# Patient Record
Sex: Female | Born: 1995 | Race: Black or African American | Hispanic: No | Marital: Single | Smoking: Never smoker
Health system: Southern US, Community
[De-identification: ages and names within clinical notes are randomized; demographics above are authoritative.]

## PROBLEM LIST (undated history)

## (undated) DIAGNOSIS — J40 Bronchitis, not specified as acute or chronic: Secondary | ICD-10-CM

## (undated) HISTORY — PX: TONSILLECTOMY: SUR1361

---
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
## 2015-06-04 ENCOUNTER — Emergency Department (HOSPITAL_BASED_OUTPATIENT_CLINIC_OR_DEPARTMENT_OTHER)
Admission: EM | Admit: 2015-06-04 | Discharge: 2015-06-04 | Disposition: A | Discharge status: Home / Self Care | Payer: 59 | Attending: Emergency Medicine | Admitting: Emergency Medicine

## 2015-06-04 ENCOUNTER — Emergency Department (HOSPITAL_BASED_OUTPATIENT_CLINIC_OR_DEPARTMENT_OTHER): Payer: 59

## 2015-06-04 ENCOUNTER — Encounter (HOSPITAL_BASED_OUTPATIENT_CLINIC_OR_DEPARTMENT_OTHER): Payer: Self-pay | Admitting: *Deleted

## 2015-06-04 DIAGNOSIS — J45909 Unspecified asthma, uncomplicated: Secondary | ICD-10-CM | POA: Diagnosis not present

## 2015-06-04 DIAGNOSIS — R079 Chest pain, unspecified: Secondary | ICD-10-CM | POA: Diagnosis present

## 2015-06-04 DIAGNOSIS — R0789 Other chest pain: Secondary | ICD-10-CM | POA: Diagnosis not present

## 2015-06-04 DIAGNOSIS — Z79899 Other long term (current) drug therapy: Secondary | ICD-10-CM | POA: Insufficient documentation

## 2015-06-04 DIAGNOSIS — Z3202 Encounter for pregnancy test, result negative: Secondary | ICD-10-CM | POA: Diagnosis not present

## 2015-06-04 DIAGNOSIS — R519 Headache, unspecified: Secondary | ICD-10-CM

## 2015-06-04 DIAGNOSIS — R51 Headache: Secondary | ICD-10-CM | POA: Insufficient documentation

## 2015-06-04 HISTORY — DX: Bronchitis, not specified as acute or chronic: J40

## 2015-06-04 LAB — TROPONIN I: Troponin I: <0.03 ng/mL (ref <0.031)

## 2015-06-04 LAB — CBC WITH DIFFERENTIAL/PLATELET
BASOS ABS: 0 10*3/uL (ref 0.0–0.1)
BASOS PCT: 0 %
EOS ABS: 0.1 10*3/uL (ref 0.0–0.7)
Eosinophils Relative: 1 %
HCT: 37.5 % (ref 36.0–46.0)
HEMOGLOBIN: 12.3 g/dL (ref 12.0–15.0)
Lymphocytes Relative: 33 %
Lymphs Abs: 2.1 10*3/uL (ref 0.7–4.0)
MCH: 29.7 pg (ref 26.0–34.0)
MCHC: 32.8 g/dL (ref 30.0–36.0)
MCV: 90.6 fL (ref 78.0–100.0)
Monocytes Absolute: 0.5 10*3/uL (ref 0.1–1.0)
Monocytes Relative: 8 %
NEUTROS PCT: 58 %
Neutro Abs: 3.6 10*3/uL (ref 1.7–7.7)
Platelets: 314 10*3/uL (ref 150–400)
RBC: 4.14 MIL/uL (ref 3.87–5.11)
RDW: 12.4 % (ref 11.5–15.5)
WBC: 6.2 10*3/uL (ref 4.0–10.5)

## 2015-06-04 LAB — COMPREHENSIVE METABOLIC PANEL
ALBUMIN: 4.3 g/dL (ref 3.5–5.0)
ALK PHOS: 60 U/L (ref 38–126)
ALT: 18 U/L (ref 14–54)
ANION GAP: 7 (ref 5–15)
AST: 19 U/L (ref 15–41)
BUN: 8 mg/dL (ref 6–20)
CALCIUM: 9 mg/dL (ref 8.9–10.3)
CO2: 24 mmol/L (ref 22–32)
CREATININE: 0.75 mg/dL (ref 0.44–1.00)
Chloride: 106 mmol/L (ref 101–111)
GFR calc Af Amer: >60 mL/min (ref >60)
GFR calc non Af Amer: >60 mL/min (ref >60)
GLUCOSE: 103 mg/dL — AB (ref 65–99)
Potassium: 3.5 mmol/L (ref 3.5–5.1)
SODIUM: 137 mmol/L (ref 135–145)
TOTAL PROTEIN: 7.8 g/dL (ref 6.5–8.1)
Total Bilirubin: 0.8 mg/dL (ref 0.3–1.2)

## 2015-06-04 LAB — PREGNANCY, URINE: Preg Test, Ur: NEGATIVE

## 2015-06-04 MED ORDER — NORTRIPTYLINE HCL 10 MG PO CAPS: 10.0000 mg | ORAL_CAPSULE | Freq: Every day | ORAL | Status: AC

## 2015-06-04 MED ORDER — CYCLOBENZAPRINE HCL 10 MG PO TABS
5.0000 mg | ORAL_TABLET | Freq: Once | ORAL | Status: AC
  Administered 2015-06-04: 5 mg via ORAL
  Filled 2015-06-04: qty 1

## 2015-06-04 MED ORDER — CYCLOBENZAPRINE HCL 5 MG PO TABS: 5.0000 mg | ORAL_TABLET | Freq: Three times a day (TID) | ORAL | Status: DC | PRN

## 2015-06-04 MED ORDER — IBUPROFEN 600 MG PO TABS: 600.0000 mg | ORAL_TABLET | Freq: Four times a day (QID) | ORAL | Status: DC | PRN

## 2015-06-04 MED ORDER — MORPHINE SULFATE (PF) 4 MG/ML IV SOLN
4.0000 mg | Freq: Once | INTRAVENOUS | Status: AC
  Administered 2015-06-04: 4 mg via INTRAVENOUS
  Filled 2015-06-04: qty 1

## 2015-06-04 MED FILL — IBUPROFEN 600 MG TABLET: 600 | 7 days supply | Qty: 30 | Fill #0

## 2015-06-04 MED FILL — CYCLOBENZAPRINE 5 MG TABLET: 5 | 3 days supply | Qty: 10 | Fill #0

## 2015-06-04 MED FILL — NORTRIPTYLINE HCL 10 MG CAP: 10 | 30 days supply | Qty: 30 | Fill #0

## 2015-06-04 NOTE — ED Notes (Signed)
C/o mid to right c/p that started at 1145 this am with sob and h/a. Pt took albuterol at work. Now c/o h/a and chest tightness.Arrived via [facility] and they gave 324 mg aspirin and 20 ga to left hand SL.

## 2015-06-04 NOTE — Discharge Instructions (Signed)
Take motrin for pain.  Take flexeril for muscle spasms.   Take nortriptyline for headaches.   Follow up with your doctor and neurologist.   Return to ER if you have severe headaches, vomiting, chest pain, shortness of breath.

## 2015-06-04 NOTE — ED Notes (Signed)
Patient preparing for discharge. 

## 2015-06-04 NOTE — ED Notes (Signed)
Patient transported to and from radiology department via stretcher. 

## 2015-06-04 NOTE — ED Provider Notes (Signed)
CSN: [id number]     Arrival date & time 06/04/15  1314 History   First MD Initiated Contact with Patient 06/04/15 1319     No chief complaint on file.    (Consider location/radiation/quality/duration/timing/severity/associated sxs/prior Treatment) The history is provided by the patient.  Tammy Stafford is a 20 y.o. female hx of bronchitis here with chest pain, headaches. Patient has chronic headaches and has been seen previously by neurology. She was started on Nortriptyline and finished a prednisone taper several months ago but without nortriptyline. Has been having intermittent headaches for the last several months. Denies any nausea vomiting from that. Patient also has intermittent chest tightness that happens at work. Today around 11:45 AM, patient complains of some chest tightness at work. She tried albuterol inhaler but didn't work. She was given aspirin by one of her colleagues had a neb treatment but still feels tight. Denies fever or cough. Denies hx of CAD and no family hx of CAD.          Past Medical History  Diagnosis Date  . Asthma   . Bronchitis    Past Surgical History  Procedure Laterality Date  . Tonsillectomy     No family history on file. Social History  Substance Use Topics  . Smoking status: Never Smoker   . Smokeless tobacco: Never Used  . Alcohol Use: No   OB History    No data available     Review of Systems  Cardiovascular: Positive for chest pain.  All other systems reviewed and are negative.     Allergies  Review of patient's allergies indicates no known allergies.  Home Medications   Prior to Admission medications   Medication Sig Start Date End Date Taking? Authorizing Provider  albuterol (PROVENTIL HFA;VENTOLIN HFA) 108 (90 BASE) MCG/ACT inhaler Inhale 2 puffs into the lungs every 6 (six) hours as needed. Wheezing and shortness of breath   Yes Historical Provider, MD  ibuprofen (ADVIL,MOTRIN) 600 MG tablet Take 1 tablet (600 mg total)  by mouth every 6 (six) hours as needed for pain. 01/21/12  Yes Fayrene Helper, PA-C   BP 105/74 mmHg  Pulse 82  Temp(Src) 98.8 F (37.1 C) (Oral)  Resp 20  Ht  (1.6 m)  Wt 155 lb (70.308 kg)  BMI 27.46 kg/m2  SpO2 99% Physical Exam  Constitutional: She is oriented to person, place, and time. She appears well-developed and well-nourished.  HENT:  Head: Normocephalic.  Mouth/Throat: Oropharynx is clear and moist.  Eyes: Conjunctivae are normal. Pupils are equal, round, and reactive to light.  Neck: Normal range of motion. Neck supple.  Cardiovascular: Normal rate, regular rhythm and normal heart sounds.   Pulmonary/Chest: Effort normal and breath sounds normal.  Reproducible chest tenderness.   Abdominal: Soft. Bowel sounds are normal. She exhibits no distension. There is no tenderness. There is no rebound.  Musculoskeletal: Normal range of motion. She exhibits no edema or tenderness.  Neurological: She is alert and oriented to person, place, and time. No cranial nerve deficit. Coordination normal.  Skin: Skin is warm and dry.  Psychiatric: She has a normal mood and affect. Her behavior is normal. Judgment and thought content normal.  Nursing note and vitals reviewed.   ED Course  Procedures (including critical care time) Labs Review Labs Reviewed  COMPREHENSIVE METABOLIC PANEL - Abnormal; Notable for the following:    Glucose, Bld 103 (*)    All other components within normal limits  CBC WITH DIFFERENTIAL/PLATELET  TROPONIN I  PREGNANCY, URINE    Imaging Review Dg Chest 2 View  06/04/2015  CLINICAL DATA:  Right-sided chest pain. EXAM: CHEST  2 VIEW COMPARISON:  None. FINDINGS: The heart size and mediastinal contours are within normal limits. Both lungs are clear. The visualized skeletal structures are unremarkable. IMPRESSION: No active cardiopulmonary disease. Electronically Signed   By: Richarda Overlie M.D.   On: 06/04/2015 13:55   I have personally reviewed and evaluated  these images and lab results as part of my medical decision-making.   EKG Interpretation   Date/Time:  Thursday June 04 2015 13:27:21 EST Ventricular Rate:  71 PR Interval:  147 QRS Duration: 75 QT Interval:  376 QTC Calculation: 409 R Axis:   54 Text Interpretation:  Sinus rhythm Baseline wander in lead(s) I II aVR aVF  V6 No significant change since last tracing Confirmed by YAO  MD, DAVID  ([id number]) on 06/04/2015 1:35:08 PM      MDM   Final diagnoses:  None   Tammy Stafford is a 20 y.o. female here with chest tightness. Lungs clear. Afebrile, not tachy. I doubt ACS or PE. Likely muscle strain vs anxiety. Will get labs, CXR. Will give flexeril.   2:53 PM Felt better. Vitals stable. Labs and CXR nl. I think likely muscle strain. Headaches are chronic, nl neuro exam and was on nortriptyline but ran out. Will dc home with flexeril, nortiptyline and have her f/u with PCP, neurology.      Richardean Canal, MD 06/04/15 [phone]

## 2015-09-09 ENCOUNTER — Encounter (HOSPITAL_BASED_OUTPATIENT_CLINIC_OR_DEPARTMENT_OTHER): Payer: Self-pay | Admitting: *Deleted

## 2015-09-09 ENCOUNTER — Emergency Department (HOSPITAL_BASED_OUTPATIENT_CLINIC_OR_DEPARTMENT_OTHER)
Admission: EM | Admit: 2015-09-09 | Discharge: 2015-09-09 | Disposition: A | Discharge status: Home / Self Care | Payer: 59 | Attending: Emergency Medicine | Admitting: Emergency Medicine

## 2015-09-09 DIAGNOSIS — F419 Anxiety disorder, unspecified: Secondary | ICD-10-CM | POA: Insufficient documentation

## 2015-09-09 DIAGNOSIS — J45909 Unspecified asthma, uncomplicated: Secondary | ICD-10-CM | POA: Insufficient documentation

## 2015-09-09 LAB — CBG MONITORING, ED: Glucose-Capillary: 105 mg/dL — ABNORMAL HIGH (ref 65–99)

## 2015-09-09 MED ORDER — LORAZEPAM 1 MG PO TABS
1.0000 mg | ORAL_TABLET | Freq: Once | ORAL | Status: AC
  Administered 2015-09-09: 1 mg via ORAL
  Filled 2015-09-09: qty 1

## 2015-09-09 MED ORDER — ACETAMINOPHEN 325 MG PO TABS
650.0000 mg | ORAL_TABLET | Freq: Once | ORAL | Status: AC
  Administered 2015-09-09: 650 mg via ORAL
  Filled 2015-09-09: qty 2

## 2015-09-09 NOTE — ED Notes (Signed)
Pt c/o anxiety attack and h/a  x 2 hrs ago

## 2015-09-09 NOTE — ED Notes (Signed)
Pt given d/c instructions as per chart. Verbalizes understanding. No questions. 

## 2015-09-09 NOTE — ED Provider Notes (Signed)
CSN: [id number]     Arrival date & time 09/09/15  1603 History   First MD Initiated Contact with Patient 09/09/15 1642     Chief Complaint  Patient presents with  . Anxiety     (Consider location/radiation/quality/duration/timing/severity/associated sxs/prior Treatment) HPI Comments: Patient presents today with anxiety.  She states that 2 hours prior to arrival she had an anxiety attack while at work.  She states that she began feeling SOB, hyperventilating, had chills, and numbness of her hand and feet.  She also reports a frontal headache.  Her headache has been persistent, but all other symptoms have resolved at this time.  She did not take any medication but wrapped her self up in blankets and did some relaxation techniques to help with her symptoms.  Her anxiety is still present, but has improved.  She reports a history of anxiety attacks and states that these symptoms felt similar.  She denies any SI or HI.    Patient is a 20 y.o. female presenting with anxiety. The history is provided by the patient.  Anxiety    Past Medical History  Diagnosis Date  . Asthma   . Bronchitis    Past Surgical History  Procedure Laterality Date  . Tonsillectomy     History reviewed. No pertinent family history. Social History  Substance Use Topics  . Smoking status: Never Smoker   . Smokeless tobacco: Never Used  . Alcohol Use: No   OB History    No data available     Review of Systems  All other systems reviewed and are negative.     Allergies  Review of patient's allergies indicates no known allergies.  Home Medications   Prior to Admission medications   Medication Sig Start Date End Date Taking? Authorizing Provider  albuterol (PROVENTIL HFA;VENTOLIN HFA) 108 (90 BASE) MCG/ACT inhaler Inhale 2 puffs into the lungs every 6 (six) hours as needed. Wheezing and shortness of breath    Historical Provider, MD  nortriptyline (PAMELOR) 10 MG capsule Take 1 capsule (10 mg total) by  mouth at bedtime. 06/04/15   Richardean Canal, MD   BP 128/97 mmHg  Pulse 98  Temp(Src) 98.7 F (37.1 C)  Resp 18  Ht  (1.6 m)  Wt 59.875 kg  BMI 23.39 kg/m2  SpO2 97%  LMP 09/09/2015 Physical Exam  Constitutional: She appears well-developed and well-nourished.  HENT:  Head: Normocephalic and atraumatic.  Mouth/Throat: Oropharynx is clear and moist.  Eyes: EOM are normal. Pupils are equal, round, and reactive to light.  Neck: Normal range of motion. Neck supple.  Cardiovascular: Normal rate, regular rhythm and normal heart sounds.   Pulmonary/Chest: Effort normal and breath sounds normal.  Musculoskeletal: Normal range of motion.  Neurological: She is alert. She has normal strength. No cranial nerve deficit or sensory deficit. Coordination and gait normal.  Skin: Skin is warm and dry.  Psychiatric: Her behavior is normal. Her mood appears anxious. She expresses no homicidal and no suicidal ideation. She expresses no suicidal plans and no homicidal plans.  Nursing note and vitals reviewed.   ED Course  Procedures (including critical care time) Labs Review Labs Reviewed  CBG MONITORING, ED - Abnormal; Notable for the following:    Glucose-Capillary 105 (*)    All other components within normal limits    Imaging Review No results found. I have personally reviewed and evaluated these images and lab results as part of my medical decision-making.   EKG Interpretation None  MDM   Final diagnoses:  None   Patient presents today with anxiety and headache.  She reports having a panic attack at work prior to arrival.  Anxiety and headache improved in the ED after given Ativan.  She has a normal neurological exam.  No SI or HI.  Feel that the patient is stable for discharge.  Return precautions given.      Santiago GladHeather Indalecio Malmstrom, PA-C 09/10/15 2222  Raeford RazorStephen Kohut, MD 09/17/15 [phone]

## 2015-09-09 NOTE — ED Notes (Signed)
CBG is 105 

## 2015-09-09 NOTE — Discharge Instructions (Signed)
Return to the Emergency Department immediately if you have thoughts of hurting yourself or hurting others Panic Attacks Panic attacks are sudden, short-livedsurges of severe anxiety, fear, or discomfort. They may occur for no reason when you are relaxed, when you are anxious, or when you are sleeping. Panic attacks may occur for a number of reasons:   Healthy people occasionally have panic attacks in extreme, life-threatening situations, such as war or natural disasters. Normal anxiety is a protective mechanism of the body that helps [place] react to danger (fight or flight response).  Panic attacks are often seen with anxiety disorders, such as panic disorder, social anxiety disorder, generalized anxiety disorder, and phobias. Anxiety disorders cause excessive or uncontrollable anxiety. They may interfere with your relationships or other life activities.  Panic attacks are sometimes seen with other mental illnesses, such as depression and posttraumatic stress disorder.  Certain medical conditions, prescription medicines, and drugs of abuse can cause panic attacks. SYMPTOMS  Panic attacks start suddenly, peak within 20 minutes, and are accompanied by four or more of the following symptoms:  Pounding heart or fast heart rate (palpitations).  Sweating.  Trembling or shaking.  Shortness of breath or feeling smothered.  Feeling choked.  Chest pain or discomfort.  Nausea or strange feeling in your stomach.  Dizziness, light-headedness, or feeling like you will faint.  Chills or hot flushes.  Numbness or tingling in your lips or hands and feet.  Feeling that things are not real or feeling that you are not yourself.  Fear of losing control or going crazy.  Fear of dying. Some of these symptoms can mimic serious medical conditions. For example, you may think you are having a heart attack. Although panic attacks can be very scary, they are not life threatening. DIAGNOSIS  Panic attacks  are diagnosed through an assessment by your health care provider. Your health care provider will ask questions about your symptoms, such as where and when they occurred. Your health care provider will also ask about your medical history and use of alcohol and drugs, including prescription medicines. Your health care provider may order blood tests or other studies to rule out a serious medical condition. Your health care provider may refer you to a mental health professional for further evaluation. TREATMENT   Most healthy people who have one or two panic attacks in an extreme, life-threatening situation will not require treatment.  The treatment for panic attacks associated with anxiety disorders or other mental illness typically involves counseling with a mental health professional, medicine, or a combination of both. Your health care provider will help determine what treatment is best for you.  Panic attacks due to physical illness usually go away with treatment of the illness. If prescription medicine is causing panic attacks, talk with your health care provider about stopping the medicine, decreasing the dose, or substituting another medicine.  Panic attacks due to alcohol or drug abuse go away with abstinence. Some adults need professional help in order to stop drinking or using drugs. HOME CARE INSTRUCTIONS   Take all medicines as directed by your health care provider.   Schedule and attend follow-up visits as directed by your health care provider. It is important to keep all your appointments. SEEK MEDICAL CARE IF:  You are not able to take your medicines as prescribed.  Your symptoms do not improve or get worse. SEEK IMMEDIATE MEDICAL CARE IF:   You experience panic attack symptoms that are different than your usual symptoms.  You have  serious thoughts about hurting yourself or others.  You are taking medicine for panic attacks and have a serious side effect. MAKE SURE  YOU:  Understand these instructions.  Will watch your condition.  Will get help right away if you are not doing well or get worse.   This information is not intended to replace advice given to you by your health care provider. Make sure you discuss any questions you have with your health care provider.   Document Released: 03/28/2005 Document Revised: 04/02/2013 Document Reviewed: 11/09/2012 Elsevier Interactive Patient Education 2016 [employer]  Risk analyst[employer]sevier Interactive Patient Education [employer] Elsevier Inc.

## 2015-10-21 ENCOUNTER — Ambulatory Visit (HOSPITAL_COMMUNITY): Admission: EM | Admit: 2015-10-21 | Discharge: 2015-10-21 | Discharge status: Absent without leave | Payer: 59

## 2015-10-21 ENCOUNTER — Encounter (HOSPITAL_COMMUNITY): Payer: Self-pay

## 2015-10-21 ENCOUNTER — Emergency Department (HOSPITAL_COMMUNITY)
Admission: EM | Admit: 2015-10-21 | Discharge: 2015-10-21 | Disposition: A | Discharge status: Home / Self Care | Payer: Self-pay | Attending: Emergency Medicine | Admitting: Emergency Medicine

## 2015-10-21 ENCOUNTER — Emergency Department (HOSPITAL_COMMUNITY): Payer: Self-pay

## 2015-10-21 DIAGNOSIS — J45909 Unspecified asthma, uncomplicated: Secondary | ICD-10-CM | POA: Insufficient documentation

## 2015-10-21 DIAGNOSIS — M79671 Pain in right foot: Secondary | ICD-10-CM | POA: Insufficient documentation

## 2015-10-21 LAB — CBC WITH DIFFERENTIAL/PLATELET
Basophils Absolute: 0 10*3/uL (ref 0.0–0.1)
Basophils Relative: 0 %
EOS ABS: 0 10*3/uL (ref 0.0–0.7)
Eosinophils Relative: 0 %
HCT: 38.2 % (ref 36.0–46.0)
HEMOGLOBIN: 12.6 g/dL (ref 12.0–15.0)
LYMPHS ABS: 2.1 10*3/uL (ref 0.7–4.0)
LYMPHS PCT: 27 %
MCH: 29.9 pg (ref 26.0–34.0)
MCHC: 33 g/dL (ref 30.0–36.0)
MCV: 90.7 fL (ref 78.0–100.0)
MONOS PCT: 5 %
Monocytes Absolute: 0.4 10*3/uL (ref 0.1–1.0)
NEUTROS PCT: 68 %
Neutro Abs: 5.2 10*3/uL (ref 1.7–7.7)
Platelets: 322 10*3/uL (ref 150–400)
RBC: 4.21 MIL/uL (ref 3.87–5.11)
RDW: 12.6 % (ref 11.5–15.5)
WBC: 7.7 10*3/uL (ref 4.0–10.5)

## 2015-10-21 LAB — COMPREHENSIVE METABOLIC PANEL
ALK PHOS: 54 U/L (ref 38–126)
ALT: 14 U/L (ref 14–54)
ANION GAP: 8 (ref 5–15)
AST: 17 U/L (ref 15–41)
Albumin: 4.2 g/dL (ref 3.5–5.0)
BILIRUBIN TOTAL: 1.2 mg/dL (ref 0.3–1.2)
BUN: 6 mg/dL (ref 6–20)
CALCIUM: 9.4 mg/dL (ref 8.9–10.3)
CO2: 24 mmol/L (ref 22–32)
CREATININE: 0.76 mg/dL (ref 0.44–1.00)
Chloride: 104 mmol/L (ref 101–111)
Glucose, Bld: 88 mg/dL (ref 65–99)
Potassium: 3.9 mmol/L (ref 3.5–5.1)
SODIUM: 136 mmol/L (ref 135–145)
TOTAL PROTEIN: 7.2 g/dL (ref 6.5–8.1)

## 2015-10-21 MED ORDER — IBUPROFEN 600 MG PO TABS: 600.0000 mg | ORAL_TABLET | Freq: Four times a day (QID) | ORAL | Status: DC | PRN

## 2015-10-21 NOTE — ED Notes (Addendum)
Patient here with right foot discoloration since February and since June developed pain with ambulation, no swelling.  States worse after dance recital in June, positive distal pulses, full ROM to foot

## 2015-10-21 NOTE — Discharge Instructions (Signed)
Please follow-up with the vascular specialist listed above.  Please do not compete until seen by them.  Please use medication as directed.  Return for redness, fever, or severe swelling.

## 2015-10-21 NOTE — ED Notes (Signed)
Declined W/C at D/C and was escorted to lobby by RN. 

## 2015-10-21 NOTE — ED Provider Notes (Signed)
CSN: [id number]     Arrival date & time 10/21/15  1239 History  By signing my name below, I, [facility], attest that this documentation has been prepared under the direction and in the presence of Roxy Horseman, PA-C. Electronically Signed: Randell Patient, ED Scribe. 10/21/2015. 1:55 PM.     Chief Complaint  Patient presents with  . Foot Pain    The history is provided by the patient. No language interpreter was used.   HPI Comments: Tammy Stafford is a 20 y.o. female with a hx of asthma who presents to the Emergency Department complaining of intermittent, gradually worsening, moderate right foot pain ongoing for the past 25 days and worse in the past 5 days. Pt states that she noticed a small area of dark discoloration on the anterior surface of her right foot 2 months ago that has gradually spread across the entire top of her foot and up to her ankle followed by pain to the affected area last month after a dance recital. Pain is worse with weight bearing and palpation. She notes that has been seen multiple times for this complaint by other providers who were unable to determine the cause of her complaints. Denies receiving imaging of the right foot. Denies weakness.  Past Medical History  Diagnosis Date  . Asthma   . Bronchitis    Past Surgical History  Procedure Laterality Date  . Tonsillectomy     No family history on file. Social History  Substance Use Topics  . Smoking status: Never Smoker   . Smokeless tobacco: Never Used  . Alcohol Use: No   OB History    No data available     Review of Systems  Musculoskeletal: Positive for myalgias (right foot).  Skin: Positive for color change.  Neurological: Negative for weakness.      Allergies  Review of patient's allergies indicates no known allergies.  Home Medications   Prior to Admission medications   Medication Sig Start Date End Date Taking? Authorizing Provider  albuterol (PROVENTIL HFA;VENTOLIN  HFA) 108 (90 BASE) MCG/ACT inhaler Inhale 2 puffs into the lungs every 6 (six) hours as needed. Wheezing and shortness of breath    Historical Provider, MD  ibuprofen (ADVIL,MOTRIN) 600 MG tablet Take 1 tablet (600 mg total) by mouth every 6 (six) hours as needed. 10/21/15   Roxy Horseman, PA-C  nortriptyline (PAMELOR) 10 MG capsule Take 1 capsule (10 mg total) by mouth at bedtime. 06/04/15   [name], MD   BP 138/88 mmHg  Pulse 82  Temp(Src) 99.5 F (37.5 C) (Oral)  Resp 18  SpO2 100%  LMP 05/24/2015 Physical Exam  Physical Exam  Constitutional: Pt appears well-developed and well-nourished. No distress.  HENT:  Head: Normocephalic and atraumatic.  Eyes: Conjunctivae are normal.  Neck: Normal range of motion.  Cardiovascular: Normal rate, regular rhythm and intact distal pulses.   Capillary refill < 3 sec  Pulmonary/Chest: Effort normal and breath sounds normal.  Musculoskeletal: Pt exhibits tenderness. Pt exhibits no edema.  ROM: 5/5  Neurological: Pt  is alert. Coordination normal.  Sensation 5/5 Strength 5/5  Skin: Skin is warm and dry. Pt is not diaphoretic.  No tenting of the skin  Mottling of skin over right foot and ankle No evidence of cellulitis or abscess Psychiatric: Pt has a normal mood and affect.  Nursing note and vitals reviewed.   ED Course  Procedures   DIAGNOSTIC STUDIES: Oxygen Saturation is 100% on RA, normal by my  interpretation.    COORDINATION OF CARE: 12:58 PM Will order labs and right foot x-ray. Discussed treatment plan with pt at bedside and pt agreed to plan.  2:01 PM Returned to speak with pt about results of right foot imaging and labs. Will prescribe ibuprofen. Will provide pt with a referral to a vascular specialist. Advised to follow-up with vascular specialist. Will discharge pt.  Labs Review Labs Reviewed  CBC WITH DIFFERENTIAL/PLATELET  COMPREHENSIVE METABOLIC PANEL    Imaging Review Dg Foot Complete Right  10/21/2015   CLINICAL DATA:  20 year old female with right foot pain for 5 months. No known injury. EXAM: RIGHT FOOT COMPLETE - 3+ VIEW COMPARISON:  None. FINDINGS: There is no evidence of fracture or dislocation. There is no evidence of arthropathy or other focal bone abnormality. Soft tissues are unremarkable. No radiopaque foreign bodies are identified. IMPRESSION: Negative. Electronically Signed   By: Harmon PierJeffrey  Hu M.D.   On: 10/21/2015 13:37   I have personally reviewed and evaluated these images and lab results as part of my medical decision-making.    MDM   Final diagnoses:  Right foot pain    Patient with mottling of skin and right foot pain.  No evidence of trauma or infection.  No abscess or cellulitis.  Plain films and basic labs negative for fracture or low platelets.  Discussed with Dr. Jeraldine LootsLockwood, who agrees with plan for NSAIDs and follow-up with vascular.  I personally performed the services described in this documentation, which was scribed in my presence. The recorded information has been reviewed and is accurate.     Roxy Horsemanobert Ralf Konopka, PA-C 10/21/15 1615  Gerhard Munchobert Lockwood, MD 10/22/15 [phone]

## 2015-12-02 ENCOUNTER — Encounter: Payer: Self-pay | Admitting: Surgery

## 2015-12-02 ENCOUNTER — Other Ambulatory Visit: Payer: Self-pay | Admitting: *Deleted

## 2015-12-02 DIAGNOSIS — M79671 Pain in right foot: Secondary | ICD-10-CM

## 2015-12-02 DIAGNOSIS — I739 Peripheral vascular disease, unspecified: Secondary | ICD-10-CM

## 2015-12-04 ENCOUNTER — Ambulatory Visit (HOSPITAL_COMMUNITY)
Admission: RE | Admit: 2015-12-04 | Discharge: 2015-12-04 | Disposition: A | Discharge status: Home / Self Care | Payer: Self-pay | Source: Ambulatory Visit | Attending: Vascular Surgery | Admitting: Vascular Surgery

## 2015-12-04 ENCOUNTER — Encounter (HOSPITAL_COMMUNITY): Payer: Self-pay

## 2015-12-04 DIAGNOSIS — I739 Peripheral vascular disease, unspecified: Secondary | ICD-10-CM

## 2015-12-04 DIAGNOSIS — M79671 Pain in right foot: Secondary | ICD-10-CM

## 2015-12-07 ENCOUNTER — Other Ambulatory Visit: Payer: Self-pay | Admitting: Surgery

## 2015-12-07 ENCOUNTER — Ambulatory Visit (INDEPENDENT_AMBULATORY_CARE_PROVIDER_SITE_OTHER): Payer: Self-pay | Admitting: Surgery

## 2015-12-07 ENCOUNTER — Encounter: Payer: Self-pay | Admitting: Surgery

## 2015-12-07 VITALS — BP 113/74 | HR 79 | Temp 100.1°F | Resp 20 | Ht 63.75 in | Wt 137.0 lb

## 2015-12-07 DIAGNOSIS — L819 Disorder of pigmentation, unspecified: Secondary | ICD-10-CM

## 2015-12-07 DIAGNOSIS — M25561 Pain in right knee: Secondary | ICD-10-CM

## 2015-12-07 DIAGNOSIS — M79604 Pain in right leg: Secondary | ICD-10-CM

## 2015-12-07 NOTE — Progress Notes (Signed)
[facility] [place]  Patient name: Tammy Stafford MRN: [id number] DOB: [birth year] Sex: female  REFERRING PHYSICIAN: ER  REASON FOR CONSULT: pain right foot  HPI: Tammy Stafford is a 20 y.o. female, who is referred by the emergency department for evaluation of bilateral foot pain, right greater than left.  The patient states that she has been having right foot pain that began after a dancer suicidal several months ago.  She initially noticed an area of discoloration on the top of her right foot several months ago they gradually spread across the entire part of her foot and upper ankle.  The area is tender to palpation.  The discoloration has also begun on the left foot.  Her only medical history is that of asthma.  She denies any family history of autoimmune disorders or vascularities.  Her father did just get diagnosed with a DVT.  The patient did have an x-ray in the emergency department which was unremarkable.  Past Medical History:  Diagnosis Date  . Asthma   . Bronchitis     Family History  Problem Relation Age of Onset  . Migraines Mother   . Deep vein thrombosis Father     SOCIAL HISTORY: Social History   Social History  . Marital status: Single    Spouse name: N/A  . Number of children: N/A  . Years of education: N/A   Occupational History  . Not on file.   Social History Main Topics  . Smoking status: Never Smoker  . Smokeless tobacco: Never Used  . Alcohol use No  . Drug use: No  . Sexual activity: No     Comment: depo   Other Topics Concern  . Not on file   Social History Narrative  . No narrative on file    No Known Allergies  Current Outpatient Prescriptions  Medication Sig Dispense Refill  . albuterol (PROVENTIL HFA;VENTOLIN HFA) 108 (90 BASE) MCG/ACT inhaler Inhale 2 puffs into the lungs every 6 (six) hours as needed. Wheezing and shortness of breath    . ibuprofen (ADVIL,MOTRIN) 600 MG  tablet Take 1 tablet (600 mg total) by mouth every 6 (six) hours as needed. 30 tablet 0  . nortriptyline (PAMELOR) 10 MG capsule Take 1 capsule (10 mg total) by mouth at bedtime. 30 capsule 0   No current facility-administered medications for this visit.     REVIEW OF SYSTEMS:  [X]  denotes positive finding, [ ]  denotes negative finding Cardiac  Comments:  Chest pain or chest pressure:    Shortness of breath upon exertion:    Short of breath when lying flat:    Irregular heart rhythm:        Vascular    Pain in calf, thigh, or hip brought on by ambulation:    Pain in feet at night that wakes you up from your sleep:  x   Blood clot in your veins:    Leg swelling:  x       Pulmonary    Oxygen at home:    Productive cough:     Wheezing:         Neurologic    Sudden weakness in arms or legs:     Sudden numbness in arms or legs:     Sudden onset of difficulty speaking or slurred speech:    Temporary loss of vision in one eye:     Problems with dizziness:         Gastrointestinal  Blood in stool:     Vomited blood:         Genitourinary    Burning when urinating:     Blood in urine:        Psychiatric    Major depression:         Hematologic    Bleeding problems:    Problems with blood clotting too easily:        Skin    Rashes or ulcers:        Constitutional    Fever or chills:      PHYSICAL EXAM: Vitals:   12/07/15 1316  BP: 113/74  Pulse: 79  Resp: 20  Temp: 100.1 F (37.8 C)  TempSrc: Oral  Weight: 137 lb (62.1 kg)  Height: 5' 3.75" (1.619 m)    GENERAL: The patient is a well-nourished female, in no acute distress. The vital signs are documented above. CARDIAC: There is a regular rate and rhythm.  VASCULAR: Palpable pedal pulses PULMONARY: There is good air exchange bilaterally without wheezing or rales. ABDOMEN: Soft and non-tender with normal pitched bowel sounds.  No masses appreciated MUSCULOSKELETAL: There are no major deformities or  cyanosis. NEUROLOGIC: No focal weakness or paresthesias are detected. SKIN: Splotchy discoloration to both feet, right greater than left.  There is a bogginess to an area on the lateral side of the right foot PSYCHIATRIC: The patient has a normal affect.  DATA:  ABIs were performed today in our office which were normal with triphasic waveforms  ASSESSMENT AND PLAN: The patient has a discoloration in both feet the right being more severe.  I wonder if this is part of an autoimmune complex or vasculitis at a very early stage.  She does not have any evidence of arterial insufficiency.  I do not think this is a primary vascular problem at this time.  I am going to get an MRI of the foot to exclude the possibility of a soft tissue mass as there is some bogginess to the lateral side of her right foot.  I'm also going to refer her to rheumatology for further workup.   Tammy CalWells Hoda Hon, MD [facility] Tel [phone] Pager [phone]

## 2015-12-08 ENCOUNTER — Other Ambulatory Visit: Payer: Self-pay | Admitting: Surgery

## 2015-12-08 DIAGNOSIS — M79604 Pain in right leg: Secondary | ICD-10-CM

## 2015-12-11 NOTE — Addendum Note (Signed)
Addended by: Melodye PedMANESS-HARRISON, Kaizen Ibsen C on: 12/11/2015 02:05 PM   Modules accepted: Orders

## 2015-12-11 NOTE — Addendum Note (Signed)
Addended by: Melodye PedMANESS-HARRISON, CHANDA C on: 12/11/2015 02:01 PM   Modules accepted: Orders

## 2015-12-16 ENCOUNTER — Other Ambulatory Visit: Payer: Self-pay | Admitting: *Deleted

## 2015-12-16 DIAGNOSIS — M79671 Pain in right foot: Secondary | ICD-10-CM

## 2015-12-16 DIAGNOSIS — M79604 Pain in right leg: Secondary | ICD-10-CM

## 2015-12-24 ENCOUNTER — Inpatient Hospital Stay: Admission: RE | Admit: 2015-12-24 | Payer: Self-pay | Source: Ambulatory Visit

## 2016-01-08 ENCOUNTER — Ambulatory Visit
Admission: RE | Admit: 2016-01-08 | Discharge: 2016-01-08 | Disposition: A | Discharge status: Home / Self Care | Payer: No Typology Code available for payment source | Source: Ambulatory Visit | Attending: Surgery | Admitting: Surgery

## 2016-01-08 DIAGNOSIS — M79604 Pain in right leg: Secondary | ICD-10-CM

## 2016-01-08 DIAGNOSIS — M79671 Pain in right foot: Secondary | ICD-10-CM

## 2017-02-15 ENCOUNTER — Other Ambulatory Visit: Payer: Self-pay

## 2017-02-15 ENCOUNTER — Emergency Department (HOSPITAL_COMMUNITY): Payer: Self-pay

## 2017-02-15 ENCOUNTER — Emergency Department (HOSPITAL_COMMUNITY)
Admission: EM | Admit: 2017-02-15 | Discharge: 2017-02-16 | Disposition: A | Discharge status: Home / Self Care | Payer: Self-pay | Attending: Emergency Medicine | Admitting: Emergency Medicine

## 2017-02-15 ENCOUNTER — Encounter (HOSPITAL_COMMUNITY): Payer: Self-pay

## 2017-02-15 DIAGNOSIS — Z79899 Other long term (current) drug therapy: Secondary | ICD-10-CM | POA: Insufficient documentation

## 2017-02-15 DIAGNOSIS — R6889 Other general symptoms and signs: Secondary | ICD-10-CM

## 2017-02-15 DIAGNOSIS — J111 Influenza due to unidentified influenza virus with other respiratory manifestations: Secondary | ICD-10-CM | POA: Insufficient documentation

## 2017-02-15 DIAGNOSIS — J45909 Unspecified asthma, uncomplicated: Secondary | ICD-10-CM | POA: Insufficient documentation

## 2017-02-15 LAB — CBC WITH DIFFERENTIAL/PLATELET
BASOS PCT: 0 %
Basophils Absolute: 0 10*3/uL (ref 0.0–0.1)
Eosinophils Absolute: 0 10*3/uL (ref 0.0–0.7)
Eosinophils Relative: 1 %
HCT: 32.9 % — ABNORMAL LOW (ref 36.0–46.0)
HEMOGLOBIN: 11 g/dL — AB (ref 12.0–15.0)
Lymphocytes Relative: 25 %
Lymphs Abs: 1.5 10*3/uL (ref 0.7–4.0)
MCH: 30.5 pg (ref 26.0–34.0)
MCHC: 33.4 g/dL (ref 30.0–36.0)
MCV: 91.1 fL (ref 78.0–100.0)
MONO ABS: 0.6 10*3/uL (ref 0.1–1.0)
MONOS PCT: 11 %
NEUTROS PCT: 63 %
Neutro Abs: 3.9 10*3/uL (ref 1.7–7.7)
Platelets: 279 10*3/uL (ref 150–400)
RBC: 3.61 MIL/uL — ABNORMAL LOW (ref 3.87–5.11)
RDW: 13.3 % (ref 11.5–15.5)
WBC: 6.1 10*3/uL (ref 4.0–10.5)

## 2017-02-15 LAB — COMPREHENSIVE METABOLIC PANEL
ALBUMIN: 3.7 g/dL (ref 3.5–5.0)
ALK PHOS: 71 U/L (ref 38–126)
ALT: 12 U/L — ABNORMAL LOW (ref 14–54)
ANION GAP: 6 (ref 5–15)
AST: 17 U/L (ref 15–41)
BILIRUBIN TOTAL: 0.4 mg/dL (ref 0.3–1.2)
CALCIUM: 8.9 mg/dL (ref 8.9–10.3)
CO2: 24 mmol/L (ref 22–32)
Chloride: 106 mmol/L (ref 101–111)
Creatinine, Ser: 0.79 mg/dL (ref 0.44–1.00)
GFR calc Af Amer: >60 mL/min (ref >60)
GLUCOSE: 96 mg/dL (ref 65–99)
Potassium: 3.3 mmol/L — ABNORMAL LOW (ref 3.5–5.1)
Sodium: 136 mmol/L (ref 135–145)
TOTAL PROTEIN: 6.9 g/dL (ref 6.5–8.1)

## 2017-02-15 NOTE — ED Triage Notes (Signed)
Pt c/o flu-like symptoms; stating that she is having bad headache; body aches; productive cough x 1 day; pt states that she had the flu in Jan 2018 and has not had flu shot; pt is a/ox4

## 2017-02-16 MED ORDER — DIPHENHYDRAMINE HCL 25 MG PO CAPS
25.0000 mg | ORAL_CAPSULE | Freq: Once | ORAL | Status: AC
  Administered 2017-02-16: 25 mg via ORAL
  Filled 2017-02-16: qty 1

## 2017-02-16 MED ORDER — IBUPROFEN 200 MG PO TABS
600.0000 mg | ORAL_TABLET | Freq: Once | ORAL | Status: AC
  Administered 2017-02-16: 600 mg via ORAL
  Filled 2017-02-16: qty 1

## 2017-02-16 MED ORDER — SODIUM CHLORIDE 0.9 % IV BOLUS (SEPSIS): 1000.0000 mL | Freq: Once | INTRAVENOUS | Status: DC

## 2017-02-16 MED ORDER — OSELTAMIVIR PHOSPHATE 75 MG PO CAPS: 75.0000 mg | ORAL_CAPSULE | Freq: Two times a day (BID) | ORAL | 0 refills | Status: AC

## 2017-02-16 MED ORDER — ONDANSETRON HCL 4 MG PO TABS: 4.0000 mg | ORAL_TABLET | Freq: Four times a day (QID) | ORAL | 0 refills | Status: AC

## 2017-02-16 MED ORDER — METOCLOPRAMIDE HCL 10 MG PO TABS
10.0000 mg | ORAL_TABLET | Freq: Once | ORAL | Status: AC
  Administered 2017-02-16: 10 mg via ORAL
  Filled 2017-02-16: qty 1

## 2017-02-16 NOTE — Discharge Instructions (Addendum)
Your symptoms are consistent with the flu.  Your lab work looked good today.  Your chest x-ray showed no signs of pneumonia.  Please drink plenty of fluid at home.  It is very important that you stay hydrated.  I have written you a prescription for Tamiflu.  This medicine can make you nauseated and feel like you have to vomit.  It generally lessens the duration of symptoms by one day.  It is up to you whether or not you would like to take this medicine.  Please take over-the-counter decongestants like Sudafed for nasal congestion.  You can take 600 mg ibuprofen every 6 hours as needed for headache.  Vitamin C packets can be bought over-the-counter and help kick start your immune system.  I have written you a work note.  I [facility].  They are a local clinic who accept patients who do not have insurance.  Please schedule an appointment to establish care and for follow-up if your symptoms are not improving in a week.  Please return to the emergency department if you develop cough with shortness of breath, vomiting that does not stop or have any new or worsening symptoms.

## 2017-02-16 NOTE — ED Provider Notes (Signed)
[facility]ENC[facility] Provider Note   CSN: [id number] Arrival date & time: 02/15/17  2122     History   Chief Complaint Chief Complaint  Patient presents with  . Influenza    pt reports flu-like symptoms    HPI Tammy Stafford is a 21 y.o. female.  HPI   Tammy. Ave Stafford is a 21 year old female with a history of headaches, asthma who presents emergency room department for evaluation of flulike symptoms.  Patient states that she developed total body aches, cough, congestion, headache yesterday.  States that she was diagnosed with flu in the past and had similar symptoms.  At this time she states that her headache is located in the frontal and maxillary sinuses.  It is 10/10 in severity, constant and "aching."  She states that she has tried ibuprofen without significant relief.  Denies fever, neck stiffness, nausea/vomiting, visual disturbance, numbness/weakness.  States that her cough is productive of a clear mucus. Tried over the counter Tylenol Cold and flu without relief.  She also has some mild chest pain with cough.  No chest pain at rest or with deep breath.  Denies shortness of breath, leg swelling, hemoptysis, ear pain, sore throat.  Denies history of DVT or PE, recent surgery or immobilization. Denies sick contacts at home.  She did not get a flu shot this year.  Past Medical History:  Diagnosis Date  . Asthma   . Bronchitis     Patient Active Problem List   Diagnosis Date Noted  . Pain of right lower extremity 12/07/2015  . Discoloration of skin of foot 12/07/2015    Past Surgical History:  Procedure Laterality Date  . TONSILLECTOMY      OB History    No data available       Home Medications    Prior to Admission medications   Medication Sig Start Date End Date Taking? Authorizing Provider  albuterol (PROVENTIL HFA;VENTOLIN HFA) 108 (90 BASE) MCG/ACT inhaler Inhale 2 puffs into the lungs every 6 (six) hours as needed. Wheezing and  shortness of breath    [provider]  ibuprofen (ADVIL,MOTRIN) 600 MG tablet Take 1 tablet (600 mg total) by mouth every 6 (six) hours as needed. 10/21/15   Roxy HorsemanBrowning, Robert, PA-C  nortriptyline (PAMELOR) 10 MG capsule Take 1 capsule (10 mg total) by mouth at bedtime. 06/04/15   Charlynne PanderYao, David Hsienta, MD  ondansetron (ZOFRAN) 4 MG tablet Take 1 tablet (4 mg total) every 6 (six) hours by mouth. 02/16/17   Kellie ShropshireShrosbree, Emily J, PA-C  oseltamivir (TAMIFLU) 75 MG capsule Take 1 capsule (75 mg total) every 12 (twelve) hours by mouth. 02/16/17   Kellie ShropshireShrosbree, Emily J, PA-C    Family History Family History  Problem Relation Age of Onset  . Migraines Mother   . Deep vein thrombosis Father     Social History Social History   Tobacco Use  . Smoking status: Never Smoker  . Smokeless tobacco: Never Used  Substance Use Topics  . Alcohol use: No  . Drug use: No     Allergies   Patient has no known allergies.   Review of Systems Review of Systems  Constitutional: Positive for fatigue. Negative for chills and fever.  HENT: Positive for congestion, rhinorrhea, sinus pressure and sinus pain. Negative for ear pain, hearing loss, sore throat and trouble swallowing.   Eyes: Negative for visual disturbance.  Respiratory: Negative for shortness of breath.   Cardiovascular: Positive for chest pain. Negative for  leg swelling.  Gastrointestinal: Negative for abdominal pain, nausea and vomiting.  Musculoskeletal: Positive for myalgias (total body).  Skin: Negative for rash.  Neurological: Positive for headaches. Negative for weakness and numbness.     Physical Exam Updated Vital Signs BP 97/60 (BP Location: Right Arm)   Pulse 68   Temp 98.7 F (37.1 C) (Oral)   Resp 18   Ht 5\' 3"  (1.6 m)   Wt 59.9 kg (132 lb)   LMP 12/26/2016   SpO2 98%   BMI 23.38 kg/m   Physical Exam  Constitutional: She is oriented to person, place, and time. She appears well-developed and well-nourished. No  distress.  HENT:  Head: Normocephalic and atraumatic.  Right Ear: External ear normal.  Left Ear: External ear normal.  Bilateral TMs with good cone of light.  Clear rhinorrhea noted in the nasal cavity.  Mucous membranes moist.  No oropharyngeal erythema or tonsillar exudate.  Uvula midline.  Tenderness over frontal and maxillary sinuses.  Eyes: Conjunctivae are normal. Pupils are equal, round, and reactive to light. Right eye exhibits no discharge. Left eye exhibits no discharge.  Neck: Normal range of motion. Neck supple.  Cardiovascular: Normal rate, regular rhythm and intact distal pulses.  No murmur heard. Pulmonary/Chest: Effort normal and breath sounds normal. No stridor. No respiratory distress. She has no wheezes. She has no rales.  No chest tenderness.  Abdominal: Soft. Bowel sounds are normal. There is no tenderness. There is no guarding.  Musculoskeletal:  No leg swelling.   Lymphadenopathy:    She has cervical adenopathy.  Neurological: She is alert and oriented to person, place, and time. Coordination normal.  Mental Status:  Alert, oriented, thought content appropriate, able to give a coherent history. Speech fluent without evidence of aphasia. Able to follow 2 step commands without difficulty.  Cranial Nerves:  II:  Peripheral visual fields grossly normal, pupils equal, round, reactive to light III,IV, VI: ptosis not present, extra-ocular motions intact bilaterally  V,VII: smile symmetric, facial light touch sensation equal VIII: hearing grossly normal to voice  X: uvula elevates symmetrically  XI: bilateral shoulder shrug symmetric and strong XII: midline tongue extension without fassiculations Motor:  Normal tone. 5/5 in upper and lower extremities bilaterally including strong and equal grip strength and dorsiflexion/plantar flexion Sensory: Pinprick and light touch normal in all extremities.  Cerebellar: normal finger-to-nose with bilateral upper extremities Gait:  normal gait and balance  Skin: Skin is warm and dry. Capillary refill takes less than 2 seconds. She is not diaphoretic.  Psychiatric: She has a normal mood and affect. Her behavior is normal.  Nursing note and vitals reviewed.    ED Treatments / Results  Labs (all labs ordered are listed, but only abnormal results are displayed) Labs Reviewed  CBC WITH DIFFERENTIAL/PLATELET - Abnormal; Notable for the following components:      Result Value   RBC 3.61 (*)    Hemoglobin 11.0 (*)    HCT 32.9 (*)    All other components within normal limits  COMPREHENSIVE METABOLIC PANEL - Abnormal; Notable for the following components:   Potassium 3.3 (*)    BUN <5 (*)    ALT 12 (*)    All other components within normal limits    EKG  EKG Interpretation None       Radiology Dg Chest 2 View  Result Date: 02/15/2017 CLINICAL DATA:  Body aches, productive cough, sore throat, and congestion since yesterday. EXAM: CHEST  2 VIEW COMPARISON:  06/04/2015 FINDINGS:  The heart size and mediastinal contours are within normal limits. Both lungs are clear. The visualized skeletal structures are unremarkable. IMPRESSION: No active cardiopulmonary disease. Electronically Signed   By: Burman Nieves M.D.   On: 02/15/2017 22:10    Procedures Procedures (including critical care time)  Medications Ordered in ED Medications  metoCLOPramide (REGLAN) tablet 10 mg (10 mg Oral Given 02/16/17 0036)  diphenhydrAMINE (BENADRYL) capsule 25 mg (25 mg Oral Given 02/16/17 0036)  ibuprofen (ADVIL,MOTRIN) tablet 600 mg (600 mg Oral Given 02/16/17 0036)     Initial Impression / Assessment and Plan / ED Course  I have reviewed the triage vital signs and the nursing notes.  Pertinent labs & imaging results that were available during my care of the patient were reviewed by me and considered in my medical decision making (see chart for details).     Patient presents with flulike symptoms.  Labs reviewed.  CMP with mild  hypokalemia (K 3.3), discussed adding potassium to patient's diet and patient agrees.  Otherwise CMP unremarkable.  CBC unremarkable.  Chest x-ray without pneumonia.  No pleuritic CP, leg swelling, hemoptysis, history of DVT or PE, recent immobility, tachypnea and do not suspect PE.  Pt HA treated and improved while in ED.  Presentation is non concerning for [facility], ICH, Meningitis, or temporal arteritis. Pt is afebrile with no focal neuro deficits, nuchal rigidity, or change in vision. Counseled patient on importance of rehydration and pushing fluids at home. Will send home with a prescription for Tamiflu, have counseled patient that this can cause nausea and vomiting.  Discussed return precautions.  Patient agrees and voiced understanding.  Final Clinical Impressions(s) / ED Diagnoses   Final diagnoses:  Flu-like symptoms    ED Discharge Orders        Ordered    oseltamivir (TAMIFLU) 75 MG capsule  Every 12 hours     02/16/17 0158    ondansetron (ZOFRAN) 4 MG tablet  Every 6 hours     02/16/17 0158       Kellie Shropshire, PA-C 02/16/17 1202    Tegeler, Canary Brim, MD 02/18/17 1319

## 2017-05-07 ENCOUNTER — Other Ambulatory Visit: Payer: Self-pay

## 2017-05-07 ENCOUNTER — Emergency Department (HOSPITAL_COMMUNITY)
Admission: EM | Admit: 2017-05-07 | Discharge: 2017-05-07 | Disposition: A | Discharge status: Home / Self Care | Payer: Self-pay | Attending: Emergency Medicine | Admitting: Emergency Medicine

## 2017-05-07 ENCOUNTER — Encounter (HOSPITAL_COMMUNITY): Payer: Self-pay | Admitting: *Deleted

## 2017-05-07 DIAGNOSIS — W19XXXA Unspecified fall, initial encounter: Secondary | ICD-10-CM | POA: Insufficient documentation

## 2017-05-07 DIAGNOSIS — S93601A Unspecified sprain of right foot, initial encounter: Secondary | ICD-10-CM | POA: Insufficient documentation

## 2017-05-07 DIAGNOSIS — Z0279 Encounter for issue of other medical certificate: Secondary | ICD-10-CM | POA: Insufficient documentation

## 2017-05-07 DIAGNOSIS — Z79899 Other long term (current) drug therapy: Secondary | ICD-10-CM | POA: Insufficient documentation

## 2017-05-07 DIAGNOSIS — Y929 Unspecified place or not applicable: Secondary | ICD-10-CM | POA: Insufficient documentation

## 2017-05-07 DIAGNOSIS — Y939 Activity, unspecified: Secondary | ICD-10-CM | POA: Insufficient documentation

## 2017-05-07 DIAGNOSIS — Y999 Unspecified external cause status: Secondary | ICD-10-CM | POA: Insufficient documentation

## 2017-05-07 DIAGNOSIS — J45909 Unspecified asthma, uncomplicated: Secondary | ICD-10-CM | POA: Insufficient documentation

## 2017-05-07 NOTE — ED Provider Notes (Signed)
[facility] EMERGENCY DEPARTMENT Provider Note   CSN: [id number] Arrival date & time: 05/07/17  1139     History   Chief Complaint Chief Complaint  Patient presents with  . Foot Pain    HPI Tammy Stafford is a 22 y.o. female.  HPI   22 year old female presents today with complaints of foot pain.  Patient has a history of chronic right foot pain.  She has been seen by numerous specialists in the past with no acute findings.  She notes approximately 1 week ago she fell and aggravated her foot.  [facility] with x-ray showing no acute findings.  She notes she has been wrapping this and taking over-the-counter medication.  She notes her symptoms have improved and is ready to go back to work but needs a work note.  She denies any ankle pain, pain to the remainder of the extremity, swelling edema or redness.  Past Medical History:  Diagnosis Date  . Asthma   . Bronchitis     Patient Active Problem List   Diagnosis Date Noted  . Pain of right lower extremity 12/07/2015  . Discoloration of skin of foot 12/07/2015    Past Surgical History:  Procedure Laterality Date  . TONSILLECTOMY      OB History    No data available       Home Medications    Prior to Admission medications   Medication Sig Start Date End Date Taking? Authorizing Provider  albuterol (PROVENTIL HFA;VENTOLIN HFA) 108 (90 BASE) MCG/ACT inhaler Inhale 2 puffs into the lungs every 6 (six) hours as needed. Wheezing and shortness of breath    [provider]  ibuprofen (ADVIL,MOTRIN) 600 MG tablet Take 1 tablet (600 mg total) by mouth every 6 (six) hours as needed. 10/21/15   Roxy Horseman, PA-C  nortriptyline (PAMELOR) 10 MG capsule Take 1 capsule (10 mg total) by mouth at bedtime. 06/04/15   [name], MD  ondansetron (ZOFRAN) 4 MG tablet Take 1 tablet (4 mg total) every 6 (six) hours by mouth. 02/16/17   Kellie Shropshire, PA-C  oseltamivir  (TAMIFLU) 75 MG capsule Take 1 capsule (75 mg total) every 12 (twelve) hours by mouth. 02/16/17   Kellie Shropshire, PA-C    Family History Family History  Problem Relation Age of Onset  . Migraines Mother   . Deep vein thrombosis Father     Social History Social History   Tobacco Use  . Smoking status: Never Smoker  . Smokeless tobacco: Never Used  Substance Use Topics  . Alcohol use: No  . Drug use: No     Allergies   Patient has no known allergies.   Review of Systems Review of Systems  All other systems reviewed and are negative.    Physical Exam Updated Vital Signs BP 119/77 (BP Location: Right Arm)   Pulse 99   Temp 98.4 F (36.9 C) (Oral)   Resp 16   LMP 04/21/2017   SpO2 100%   Physical Exam  Constitutional: She is oriented to person, place, and time. She appears well-developed and well-nourished.  HENT:  Head: Normocephalic and atraumatic.  Eyes: Conjunctivae are normal. Pupils are equal, round, and reactive to light. Right eye exhibits no discharge. Left eye exhibits no discharge. No scleral icterus.  Neck: Normal range of motion. No JVD present. No tracheal deviation present.  Pulmonary/Chest: Effort normal. No stridor.  Musculoskeletal:  Right lower extremity atraumatic no swelling  or edema.  Slight tenderness palpation of the dorsum of the foot, ankle nontender no redness or swelling  Neurological: She is alert and oriented to person, place, and time. Coordination normal.  Psychiatric: She has a normal mood and affect. Her behavior is normal. Judgment and thought content normal.  Nursing note and vitals reviewed.    ED Treatments / Results  Labs (all labs ordered are listed, but only abnormal results are displayed) Labs Reviewed - No data to display  EKG  EKG Interpretation None       Radiology No results found.  Procedures Procedures (including critical care time)  Medications Ordered in ED Medications - No data to  display   Initial Impression / Assessment and Plan / ED Course  I have reviewed the triage vital signs and the nursing notes.  Pertinent labs & imaging results that were available during my care of the patient were reviewed by me and considered in my medical decision making (see chart for details).      Final Clinical Impressions(s) / ED Diagnoses   Final diagnoses:  Sprain of right foot, initial encounter    22 year old female presents today for work note.  She has chronic foot pain that has worsened after a fall.  She reports she is able to go back to work, she was given a work note, podiatry follow-up and strict return precautions.  ED Discharge Orders    None       Rosalio LoudHedges, Ellamae Lybeck, PA-C 05/07/17 1301    Nira Connardama, Pedro Eduardo, MD 05/08/17 2108

## 2017-05-07 NOTE — ED Triage Notes (Signed)
Pt reports hx of foot pain for extended amount of time but had recent fall which has increased her pain. Ambulatory at triage.

## 2017-05-07 NOTE — Discharge Instructions (Signed)
Please read attached information. If you experience any new or worsening signs or symptoms please return to the emergency room for evaluation. Please follow-up with your primary care provider or specialist as discussed.  °

## 2017-05-07 NOTE — ED Notes (Signed)
Declined W/C at D/C and was escorted to lobby by RN. 

## 2017-05-31 ENCOUNTER — Ambulatory Visit (INDEPENDENT_AMBULATORY_CARE_PROVIDER_SITE_OTHER): Payer: PRIVATE HEALTH INSURANCE

## 2017-05-31 ENCOUNTER — Ambulatory Visit (INDEPENDENT_AMBULATORY_CARE_PROVIDER_SITE_OTHER): Payer: PRIVATE HEALTH INSURANCE | Admitting: Podiatry

## 2017-05-31 VITALS — BP 116/81 | HR 91 | Ht 63.0 in | Wt 132.0 lb

## 2017-05-31 DIAGNOSIS — M65979 Unspecified synovitis and tenosynovitis, unspecified ankle and foot: Secondary | ICD-10-CM

## 2017-05-31 DIAGNOSIS — M659 Synovitis and tenosynovitis, unspecified: Secondary | ICD-10-CM

## 2017-05-31 DIAGNOSIS — R52 Pain, unspecified: Secondary | ICD-10-CM

## 2017-05-31 DIAGNOSIS — M7751 Other enthesopathy of right foot: Secondary | ICD-10-CM

## 2017-05-31 DIAGNOSIS — M722 Plantar fascial fibromatosis: Secondary | ICD-10-CM | POA: Diagnosis not present

## 2017-05-31 DIAGNOSIS — M779 Enthesopathy, unspecified: Secondary | ICD-10-CM

## 2017-05-31 DIAGNOSIS — R231 Pallor: Secondary | ICD-10-CM | POA: Diagnosis not present

## 2017-05-31 DIAGNOSIS — M778 Other enthesopathies, not elsewhere classified: Secondary | ICD-10-CM

## 2017-05-31 MED ORDER — METHYLPREDNISOLONE 4 MG PO TBPK: ORAL_TABLET | ORAL | 0 refills | Status: AC

## 2017-05-31 NOTE — Progress Notes (Signed)
   Subjective:    Patient ID: Tammy Stafford, female    DOB: 06/18/1995, 22 y.o.   MRN: [id number]  HPI  Chief Complaint  Patient presents with  . Foot Pain    right foot pain and bruising       Review of Systems  All other systems reviewed and are negative.      Objective:   Physical Exam        Assessment & Plan:

## 2017-06-04 NOTE — Progress Notes (Signed)
   Subjective:  22 year old female presenting today as a new patient with a chief complaint of pain to the right foot that has been intermittent for the past 2 years. She reports associated bruising to the foot. She notes pain to the dorsum of the foot, heel and ankle. She has not done anything to treat the symptoms. 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 Patient is here for further evaluation and treatment.   Past Medical History:  Diagnosis Date  . Asthma   . Bronchitis       Objective / Physical Exam:  General:  The patient is alert and oriented x3 in no acute distress. Dermatology:  Skin is warm, dry and supple bilateral lower extremities. Negative for open lesions or macerations. Vascular:  Palpable pedal pulses bilaterally. No edema or erythema noted. Capillary refill within normal limits. Neurological:  Epicritic and protective threshold grossly intact bilaterally.  Musculoskeletal Exam:  Pain on palpation to the anterior lateral medial aspects of the patient's right ankle. Mild edema noted. Tenderness to palpation at the medial calcaneal tubercale and through the insertion of the plantar fascia of the right foot. Pain with palpation to the right midfoot as well. Range of motion within normal limits to all pedal and ankle joints bilateral. Muscle strength 5/5 in all groups bilateral.   Radiographic Exam:  Normal osseous mineralization. Joint spaces preserved. No fracture/dislocation/boney destruction. Calcaneal spur present with mild thickening of plantar fascia right. No other soft tissue abnormalities or radiopaque foreign bodies.   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 X-Rays reviewed.  #2 injection of 0.5 mL Celestone Soluspan injected in the patient's right ankle. #3 Injection of 0.5 mLs Celestone Soluspan  injected into the right plantar fascia. #4 Injection of 0.5 mLs Celestone Soluspan injected into the right midfoot.  #5 Orders placed for arthritic panel. #6 CAM boot dispensed. Weightbearing as tolerated.  #7 Prescription for Medrol Dose Pak provided to patient. #8 Return to clinic in 2 weeks.   Tammy ShellingBrent M. Kimila Papaleo, DPM [facility]  Dr. Felecia ShellingBrent M. Elizebeth Kluesner, DPM    [address]                                        [place], [place] [address]                Office [phone]  Fax [phone]

## 2017-06-14 ENCOUNTER — Ambulatory Visit: Payer: PRIVATE HEALTH INSURANCE | Admitting: Podiatry

## 2017-06-15 ENCOUNTER — Other Ambulatory Visit: Payer: Self-pay | Admitting: Podiatry

## 2017-06-15 DIAGNOSIS — M659 Synovitis and tenosynovitis, unspecified: Secondary | ICD-10-CM

## 2017-08-16 ENCOUNTER — Other Ambulatory Visit: Payer: Self-pay

## 2017-08-16 ENCOUNTER — Encounter (HOSPITAL_BASED_OUTPATIENT_CLINIC_OR_DEPARTMENT_OTHER): Payer: Self-pay | Admitting: *Deleted

## 2017-08-16 ENCOUNTER — Emergency Department (HOSPITAL_BASED_OUTPATIENT_CLINIC_OR_DEPARTMENT_OTHER): Payer: Self-pay

## 2017-08-16 ENCOUNTER — Emergency Department (HOSPITAL_BASED_OUTPATIENT_CLINIC_OR_DEPARTMENT_OTHER)
Admission: EM | Admit: 2017-08-16 | Discharge: 2017-08-16 | Disposition: A | Discharge status: Home / Self Care | Payer: Self-pay | Attending: Emergency Medicine | Admitting: Emergency Medicine

## 2017-08-16 DIAGNOSIS — R0789 Other chest pain: Secondary | ICD-10-CM

## 2017-08-16 DIAGNOSIS — Z79899 Other long term (current) drug therapy: Secondary | ICD-10-CM | POA: Insufficient documentation

## 2017-08-16 DIAGNOSIS — J45909 Unspecified asthma, uncomplicated: Secondary | ICD-10-CM | POA: Insufficient documentation

## 2017-08-16 DIAGNOSIS — J4521 Mild intermittent asthma with (acute) exacerbation: Secondary | ICD-10-CM | POA: Insufficient documentation

## 2017-08-16 LAB — BASIC METABOLIC PANEL
ANION GAP: 9 (ref 5–15)
BUN: 5 mg/dL — ABNORMAL LOW (ref 6–20)
CALCIUM: 9.2 mg/dL (ref 8.9–10.3)
CO2: 22 mmol/L (ref 22–32)
CREATININE: 0.87 mg/dL (ref 0.44–1.00)
Chloride: 109 mmol/L (ref 101–111)
Glucose, Bld: 112 mg/dL — ABNORMAL HIGH (ref 65–99)
Potassium: 3.9 mmol/L (ref 3.5–5.1)
Sodium: 140 mmol/L (ref 135–145)

## 2017-08-16 LAB — CBC WITH DIFFERENTIAL/PLATELET
BASOS ABS: 0 10*3/uL (ref 0.0–0.1)
BASOS PCT: 0 %
EOS ABS: 0 10*3/uL (ref 0.0–0.7)
EOS PCT: 1 %
HCT: 36.8 % (ref 36.0–46.0)
Hemoglobin: 13 g/dL (ref 12.0–15.0)
Lymphocytes Relative: 40 %
Lymphs Abs: 2.4 10*3/uL (ref 0.7–4.0)
MCH: 32.6 pg (ref 26.0–34.0)
MCHC: 35.3 g/dL (ref 30.0–36.0)
MCV: 92.2 fL (ref 78.0–100.0)
MONO ABS: 0.4 10*3/uL (ref 0.1–1.0)
Monocytes Relative: 7 %
NEUTROS ABS: 3.2 10*3/uL (ref 1.7–7.7)
Neutrophils Relative %: 52 %
PLATELETS: 282 10*3/uL (ref 150–400)
RBC: 3.99 MIL/uL (ref 3.87–5.11)
RDW: 11.7 % (ref 11.5–15.5)
WBC: 6 10*3/uL (ref 4.0–10.5)

## 2017-08-16 LAB — TROPONIN I: Troponin I: <0.03 ng/mL (ref <0.03)

## 2017-08-16 LAB — D-DIMER, QUANTITATIVE (NOT AT ARMC)

## 2017-08-16 MED ORDER — METHYLPREDNISOLONE SODIUM SUCC 125 MG IJ SOLR
125.0000 mg | Freq: Once | INTRAMUSCULAR | Status: AC
  Administered 2017-08-16: 125 mg via INTRAVENOUS
  Filled 2017-08-16: qty 2

## 2017-08-16 MED ORDER — PREDNISONE 20 MG PO TABS: ORAL_TABLET | ORAL | 0 refills | Status: AC

## 2017-08-16 MED ORDER — IBUPROFEN 600 MG PO TABS: 600.0000 mg | ORAL_TABLET | Freq: Three times a day (TID) | ORAL | 0 refills | Status: AC

## 2017-08-16 MED ORDER — ALBUTEROL SULFATE (2.5 MG/3ML) 0.083% IN NEBU
5.0000 mg | INHALATION_SOLUTION | Freq: Once | RESPIRATORY_TRACT | Status: AC
  Administered 2017-08-16: 5 mg via RESPIRATORY_TRACT
  Filled 2017-08-16: qty 6

## 2017-08-16 MED FILL — predniSONE 20 MG TABS: 20 | 5 days supply | Qty: 11 | Fill #0

## 2017-08-16 MED FILL — IBUPROFEN 600 MG TABLET: 600 | 10 days supply | Qty: 30 | Fill #0

## 2017-08-16 NOTE — ED Provider Notes (Signed)
[facility] EMERGENCY DEPARTMENT Provider Note   CSN: [id number] Arrival date & time: 08/16/17  0813     History   Chief Complaint Chief Complaint  Patient presents with  . Shortness of Breath    HPI Tammy Stafford is a 22 y.o. female.  HPI Patient presents with acute onset central chest tightness and shortness of breath starting this morning.  Has mild, nonproductive cough.  Denies fevers but endorses chills.  Chest discomfort is worse with deep breathing and movement.  No new lower extremity swelling or pain.  No recent extended travel or immobilization.  Patient used her inhaler at home this morning with little relief. Past Medical History:  Diagnosis Date  . Asthma   . Bronchitis     Patient Active Problem List   Diagnosis Date Noted  . Pain of right lower extremity 12/07/2015  . Discoloration of skin of foot 12/07/2015    Past Surgical History:  Procedure Laterality Date  . TONSILLECTOMY       OB History   None      Home Medications    Prior to Admission medications   Medication Sig Start Date End Date Taking? Authorizing Provider  albuterol (PROVENTIL HFA;VENTOLIN HFA) 108 (90 BASE) MCG/ACT inhaler Inhale 2 puffs into the lungs every 6 (six) hours as needed. Wheezing and shortness of breath    [provider]  ibuprofen (ADVIL,MOTRIN) 600 MG tablet Take 1 tablet (600 mg total) by mouth 3 (three) times daily after meals. 08/16/17   Loren Racer, MD  methylPREDNISolone (MEDROL DOSEPAK) 4 MG TBPK tablet 6 day dose pack - take as directed 05/31/17   Felecia Shelling, DPM  nortriptyline (PAMELOR) 10 MG capsule Take 1 capsule (10 mg total) by mouth at bedtime. 06/04/15   [name], MD  ondansetron (ZOFRAN) 4 MG tablet Take 1 tablet (4 mg total) every 6 (six) hours by mouth. 02/16/17   Kellie Shropshire, PA-C  oseltamivir (TAMIFLU) 75 MG capsule Take 1 capsule (75 mg total) every 12 (twelve) hours by mouth. 02/16/17   Kellie Shropshire,  PA-C  predniSONE (DELTASONE) 20 MG tablet 3 tabs po day one, then 2 po daily x 4 days 08/16/17   Loren Racer, MD    Family History Family History  Problem Relation Age of Onset  . Migraines Mother   . Deep vein thrombosis Father     Social History Social History   Tobacco Use  . Smoking status: Never Smoker  . Smokeless tobacco: Never Used  Substance Use Topics  . Alcohol use: No  . Drug use: No     Allergies   Patient has no known allergies.   Review of Systems Review of Systems  Constitutional: Positive for chills. Negative for fever.  HENT: Negative for congestion, sinus pressure, sore throat and trouble swallowing.   Eyes: Negative for visual disturbance.  Respiratory: Positive for cough, chest tightness, shortness of breath and wheezing. Negative for choking.   Cardiovascular: Positive for chest pain. Negative for palpitations and leg swelling.  Gastrointestinal: Negative for abdominal pain, constipation, diarrhea, nausea and vomiting.  Genitourinary: Negative for dysuria, flank pain and frequency.  Musculoskeletal: Negative for back pain, myalgias and neck pain.  Skin: Negative for rash and wound.  Neurological: Negative for dizziness, weakness, light-headedness, numbness and headaches.  All other systems reviewed and are negative.    Physical Exam Updated Vital Signs BP 115/75 (BP Location: Left Arm)   Pulse 81   Temp 98.6  F (37 C) (Oral)   Resp 16   Ht  (1.6 m)   Wt 59.9 kg (132 lb)   LMP 08/14/2017   SpO2 100%   BMI 23.38 kg/m   Physical Exam  Constitutional: She is oriented to person, place, and time. She appears well-developed and well-nourished. No distress.  HENT:  Head: Normocephalic and atraumatic.  Mouth/Throat: Oropharynx is clear and moist. No oropharyngeal exudate.  Eyes: Pupils are equal, round, and reactive to light. EOM are normal.  Neck: Normal range of motion. Neck supple. No JVD present.  Cardiovascular: Normal rate,  regular rhythm, normal heart sounds and intact distal pulses. Exam reveals no gallop and no friction rub.  No murmur heard. Pulmonary/Chest: Effort normal. No stridor. No respiratory distress. She has no wheezes. She has no rales. She exhibits tenderness.  Patient with prolonged expiratory phase and diminished breath sounds especially in the bases.  Anterior chest wall tenderness especially in the right parasternal region.  No crepitance or deformity.    Abdominal: Soft. Bowel sounds are normal. There is no tenderness. There is no rebound and no guarding.  Musculoskeletal: Normal range of motion. She exhibits no edema or tenderness.  No lower extremity swelling, asymmetry or tenderness.  Distal pulses are 2+.  Lymphadenopathy:    She has no cervical adenopathy.  Neurological: She is alert and oriented to person, place, and time.  Moves all extremities without focal weakness.  Sensation intact.  Skin: Skin is warm and dry. Capillary refill takes less than 2 seconds. No rash noted. She is not diaphoretic. No erythema.  Psychiatric: She has a normal mood and affect. Her behavior is normal.  Nursing note and vitals reviewed.    ED Treatments / Results  Labs (all labs ordered are listed, but only abnormal results are displayed) Labs Reviewed  BASIC METABOLIC PANEL - Abnormal; Notable for the following components:      Result Value   Glucose, Bld 112 (*)    BUN 5 (*)    All other components within normal limits  CBC WITH DIFFERENTIAL/PLATELET  TROPONIN I  D-DIMER, QUANTITATIVE (NOT AT [facility])    EKG EKG Interpretation  Date/Time:  Wednesday Aug 16 2017 08:22:49 EDT Ventricular Rate:  71 PR Interval:  138 QRS Duration: 80 QT Interval:  372 QTC Calculation: 404 R Axis:   60 Text Interpretation:  Normal sinus rhythm with sinus arrhythmia Normal ECG Confirmed by Loren Racer ([id number]) on 08/16/2017 11:21:39 AM   Radiology Dg Chest 2 View  Result Date: 08/16/2017 CLINICAL DATA:   Chest pain, shortness of breath. EXAM: CHEST - 2 VIEW COMPARISON:  Radiographs of February 15, 2017. FINDINGS: The heart size and mediastinal contours are within normal limits. Both lungs are clear. No pneumothorax or pleural effusion is noted. The visualized skeletal structures are unremarkable. IMPRESSION: No active cardiopulmonary disease. Electronically Signed   By: Lupita Raider, M.D.   On: 08/16/2017 10:33    Procedures Procedures (including critical care time)  Medications Ordered in ED Medications  methylPREDNISolone sodium succinate (SOLU-MEDROL) 125 mg/2 mL injection 125 mg (125 mg Intravenous Given 08/16/17 1050)  albuterol (PROVENTIL) (2.5 MG/3ML) 0.083% nebulizer solution 5 mg (5 mg Nebulization Given 08/16/17 1021)     Initial Impression / Assessment and Plan / ED Course  I have reviewed the triage vital signs and the nursing notes.  Pertinent labs & imaging results that were available during my care of the patient were reviewed by me and considered in my medical  decision making (see chart for details).    Patient states she is feeling much better after breathing treatment.  Better air movement.  Continues to have reproducible chest pain.  Troponin and d-dimer are negative.  X-ray without acute findings.  No evidence of ischemia on EKG.  Chest pain that appears to be musculoskeletal in origin.  Will treat with NSAIDs.  Return precautions have been given.   Final Clinical Impressions(s) / ED Diagnoses   Final diagnoses:  Exacerbation of intermittent asthma, unspecified asthma severity  Right-sided chest wall pain    ED Discharge Orders        Ordered    ibuprofen (ADVIL,MOTRIN) 600 MG tablet  3 times daily after meals     08/16/17 1245    predniSONE (DELTASONE) 20 MG tablet     08/16/17 1245       Loren Racer, MD 08/16/17 1247

## 2017-08-16 NOTE — ED Triage Notes (Signed)
Pt reports sob x this am, used her albuterol inhaler at home with no relief. resp are easy and reg, lungs are cta a+p x 2. Rt at triage to assess.

## 2017-08-16 NOTE — ED Notes (Signed)
Assessed patient at triage for SOB. No distress noted at this time. BBS clear. Stated she took her MDI this AM.

## 2017-11-22 IMAGING — CR DG CHEST 2V
2 series · 2 of 2 positions shown · non-contrast
Comparison: 06/04/2015

CLINICAL DATA: Body aches, productive cough, sore throat, and
congestion since yesterday.

EXAM:
CHEST  2 VIEW

[chest pa]
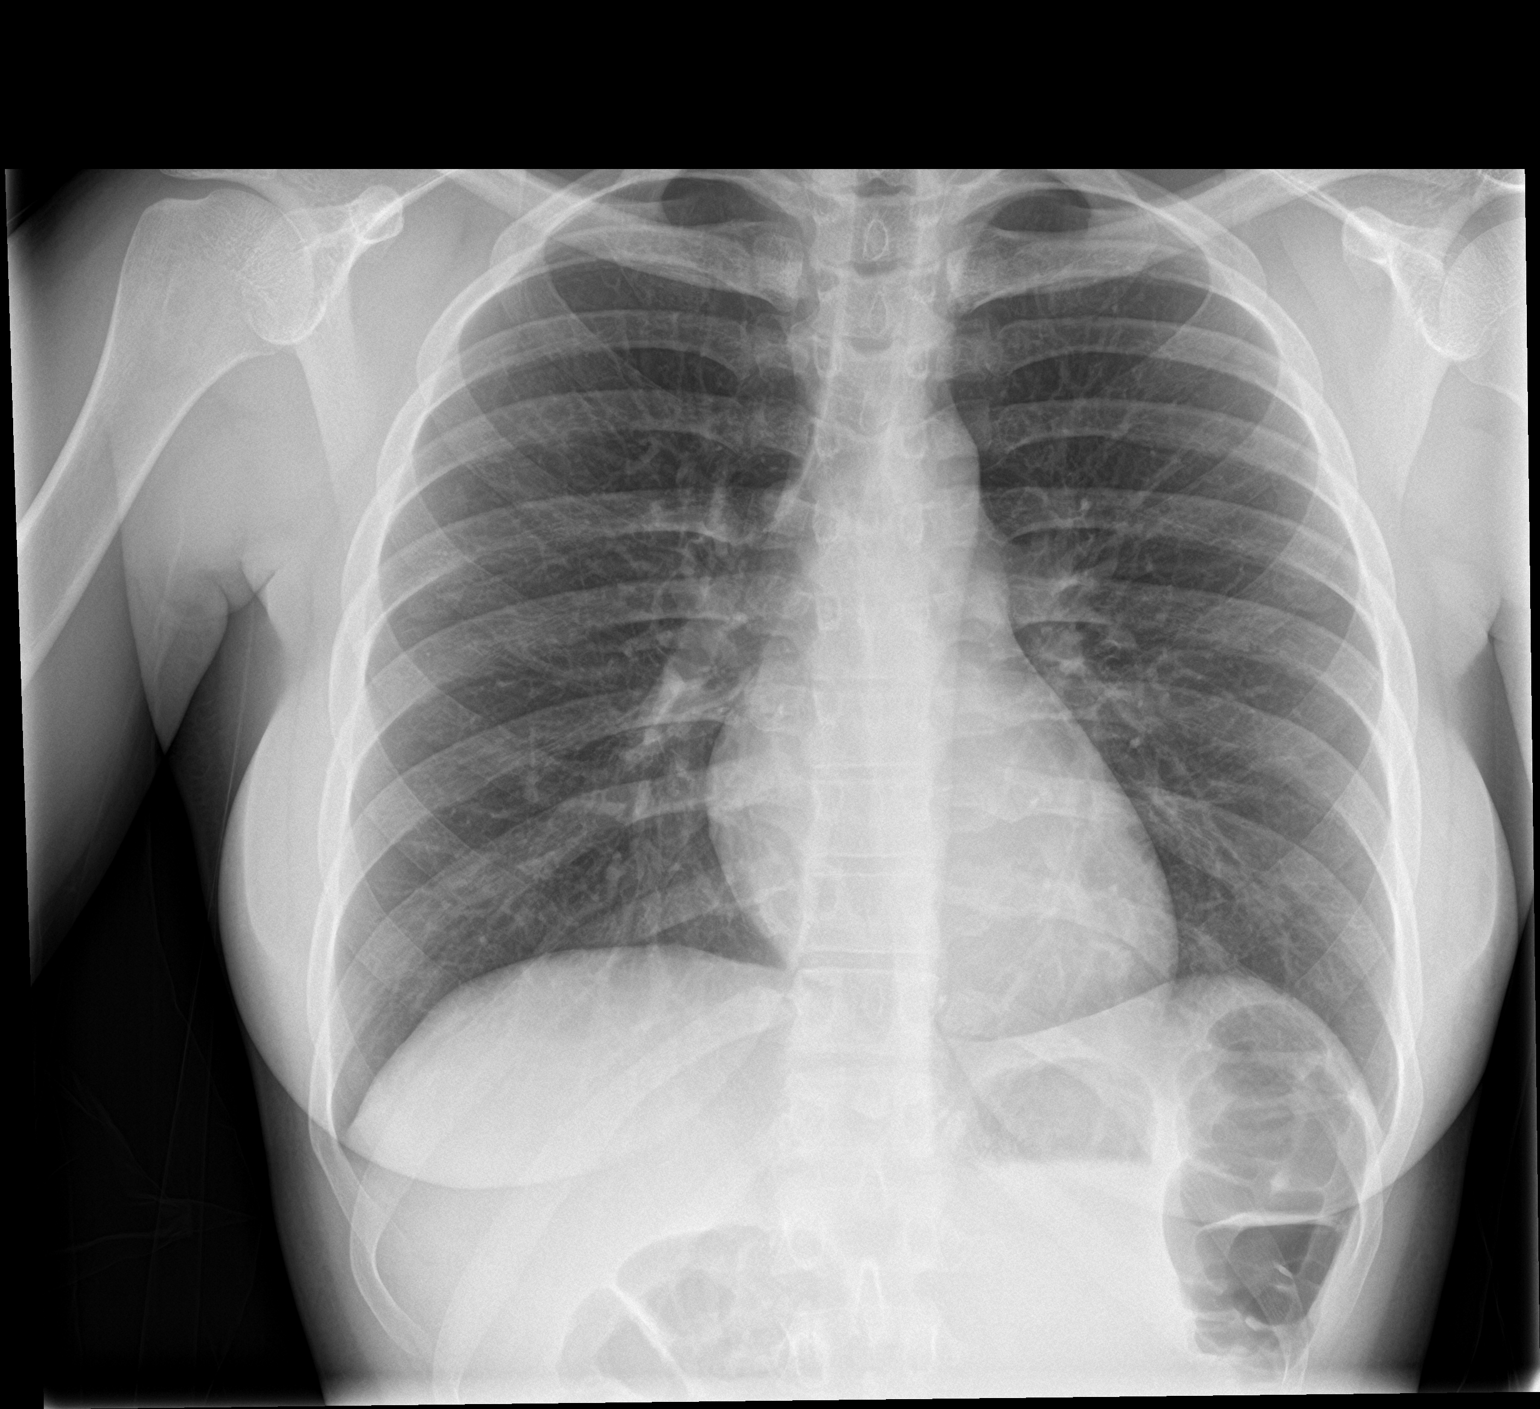

[chest lat]
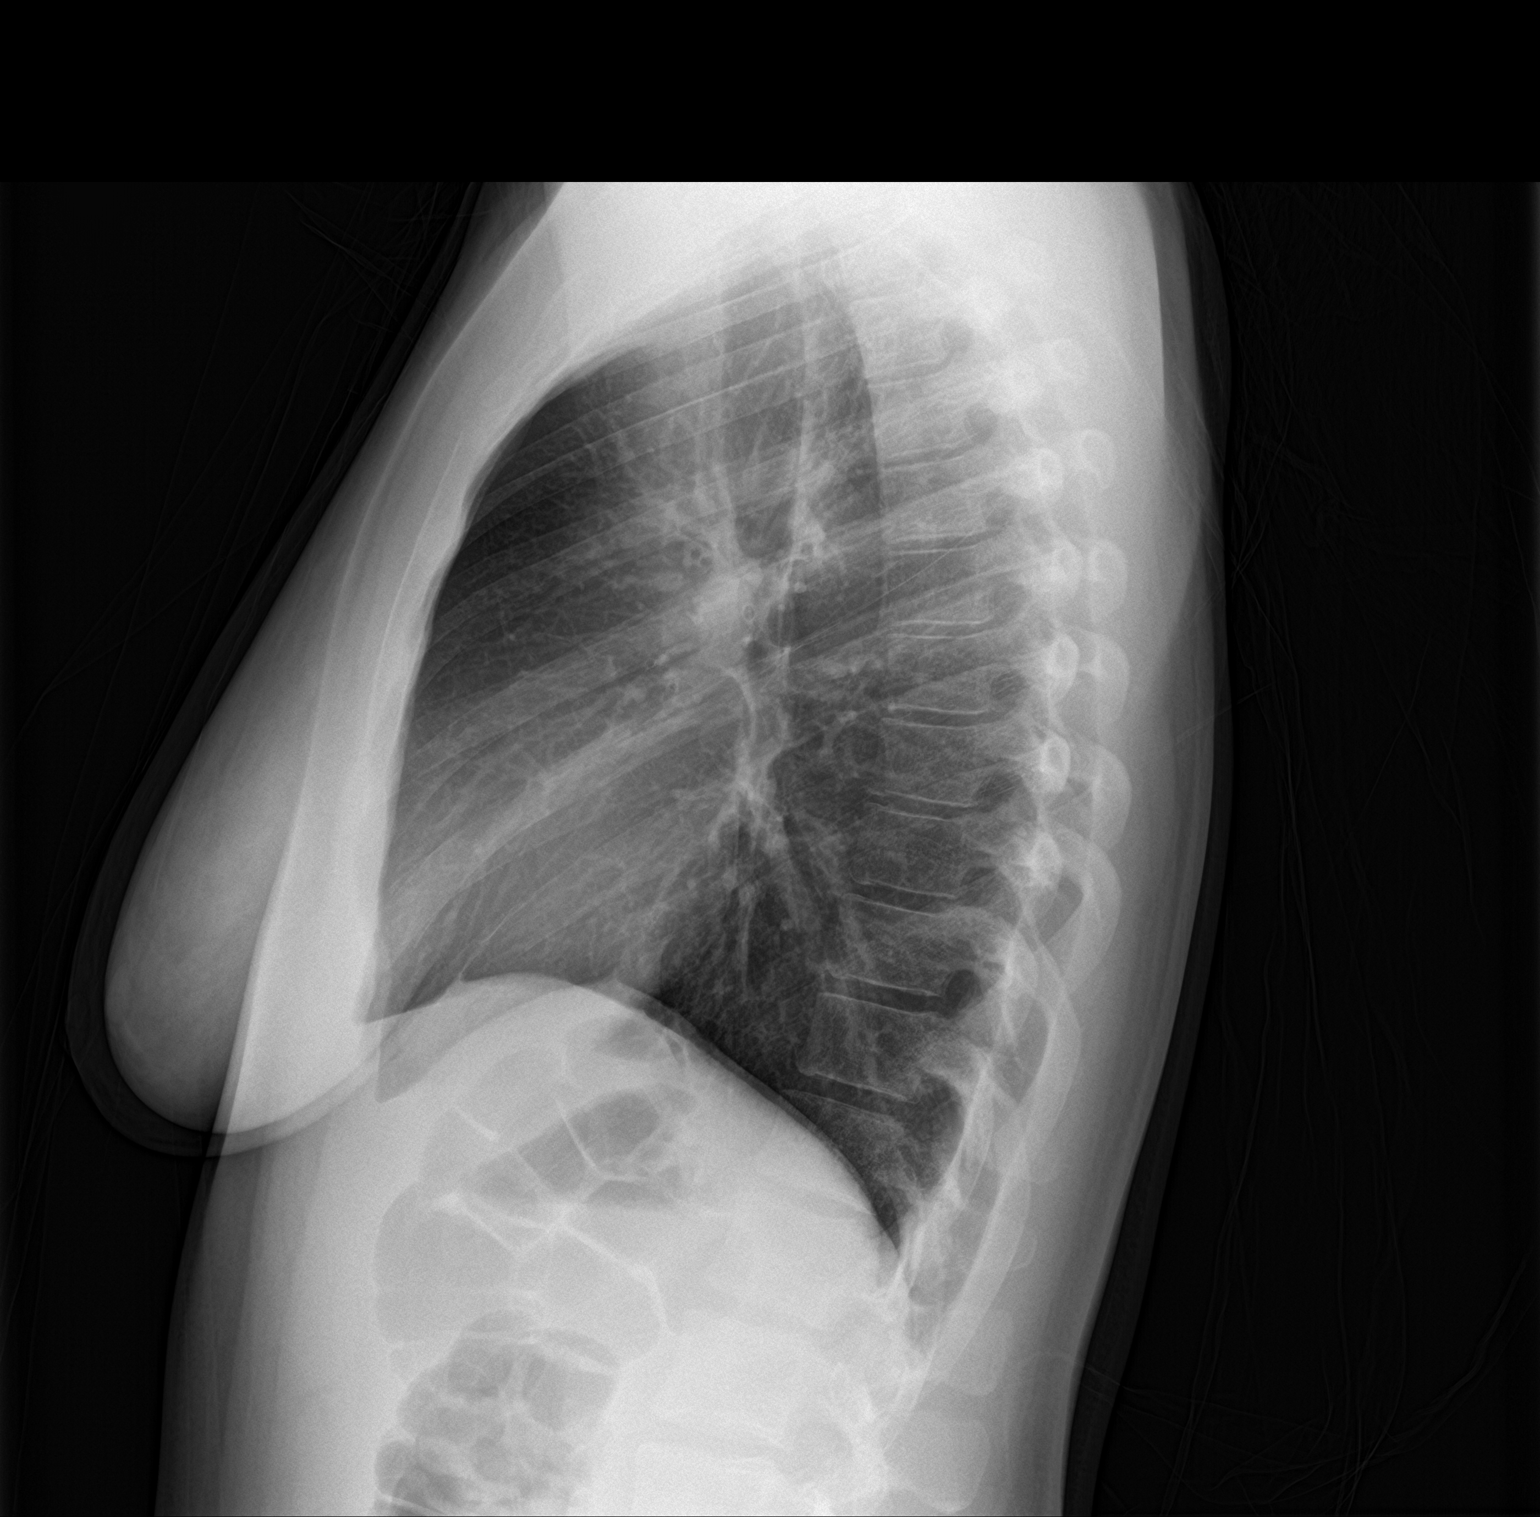

[2 of 2 positions shown; findings below may reference images not displayed]

FINDINGS: The heart size and mediastinal contours are within normal limits.
Both lungs are clear. The visualized skeletal structures are
unremarkable.
IMPRESSION: No active cardiopulmonary disease.

## 2018-05-23 IMAGING — CR DG CHEST 2V
2 series · 2 of 2 positions shown · non-contrast
Comparison: Radiographs February 15, 2017.

CLINICAL DATA: Chest pain, shortness of breath.

EXAM:
CHEST - 2 VIEW

[w chest pa]
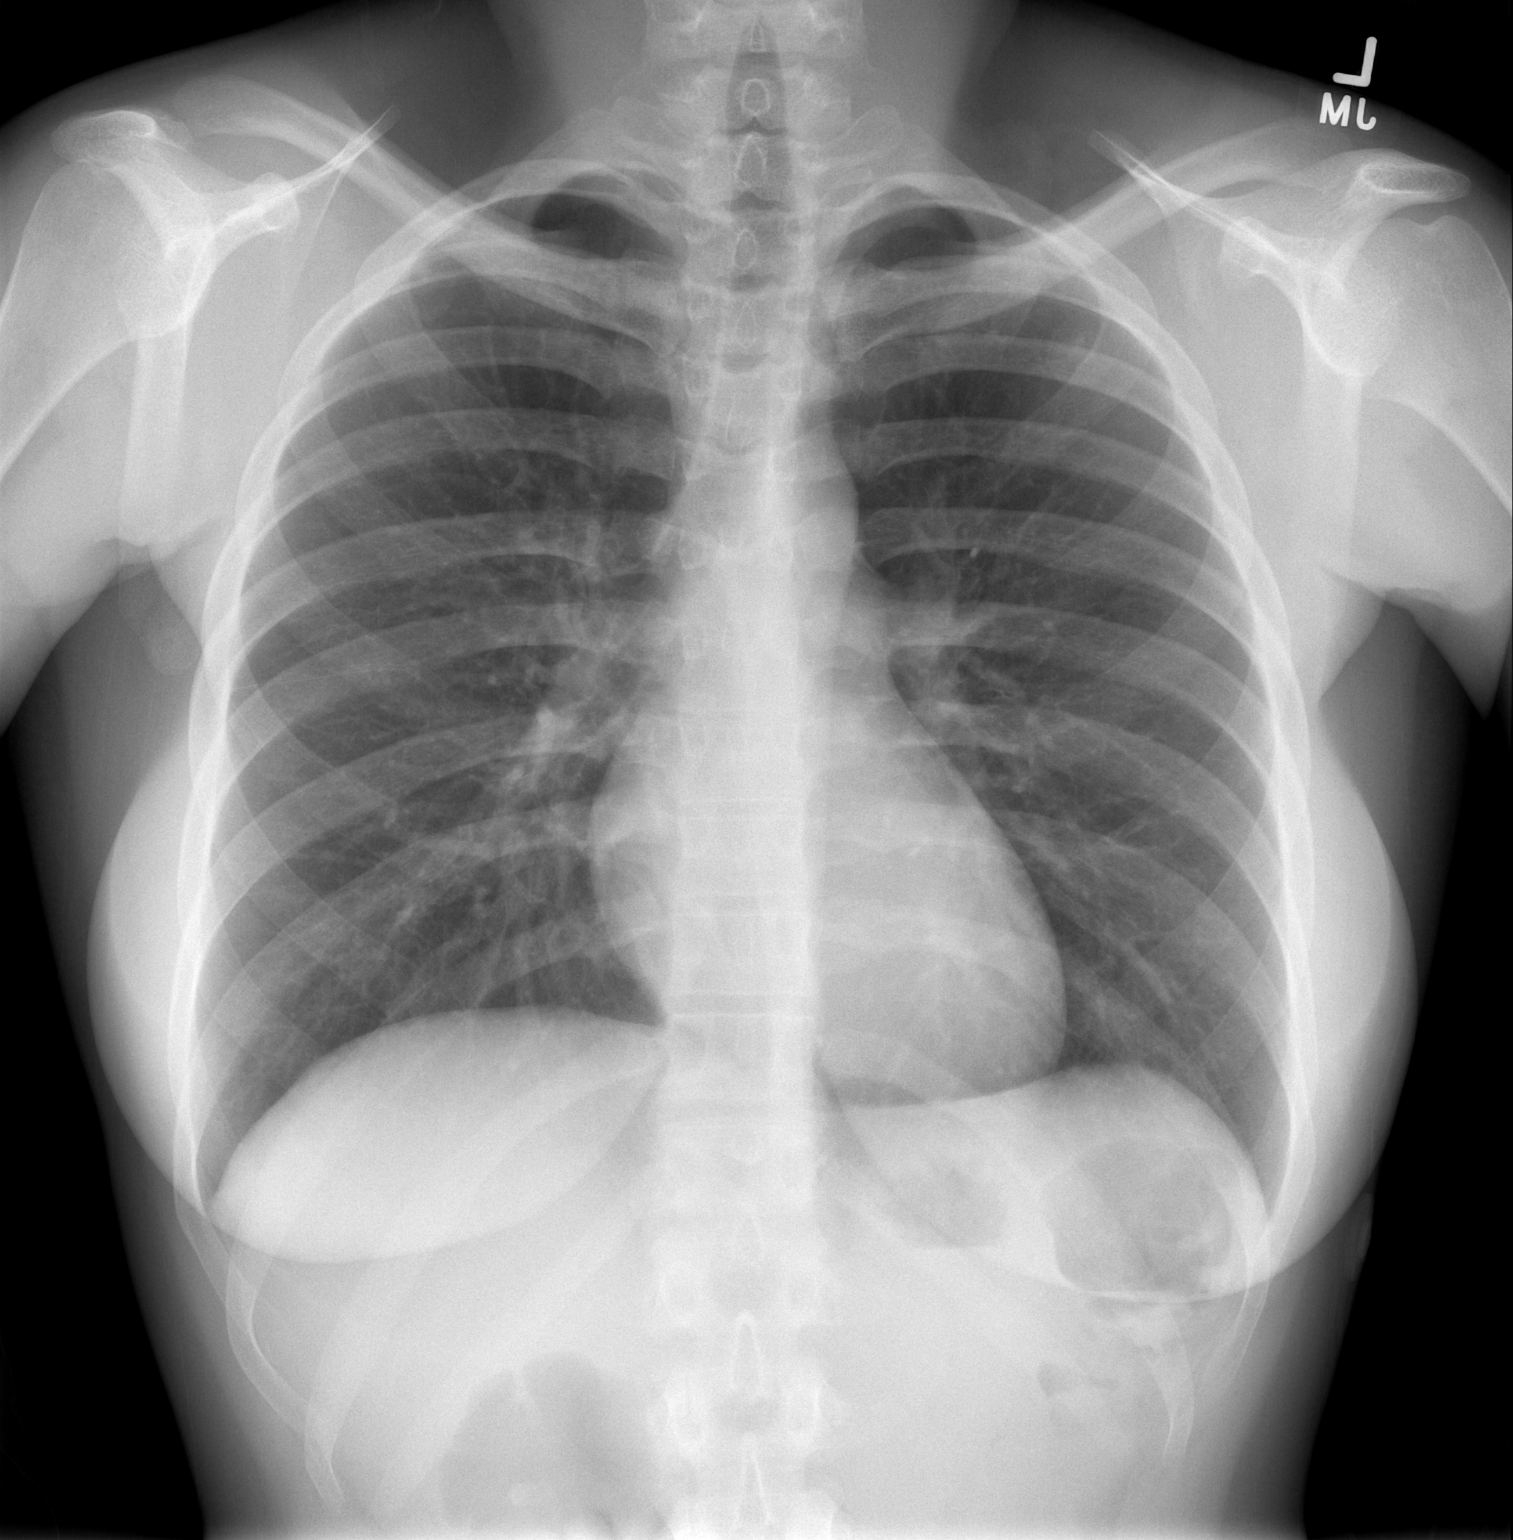

[w chest lat]
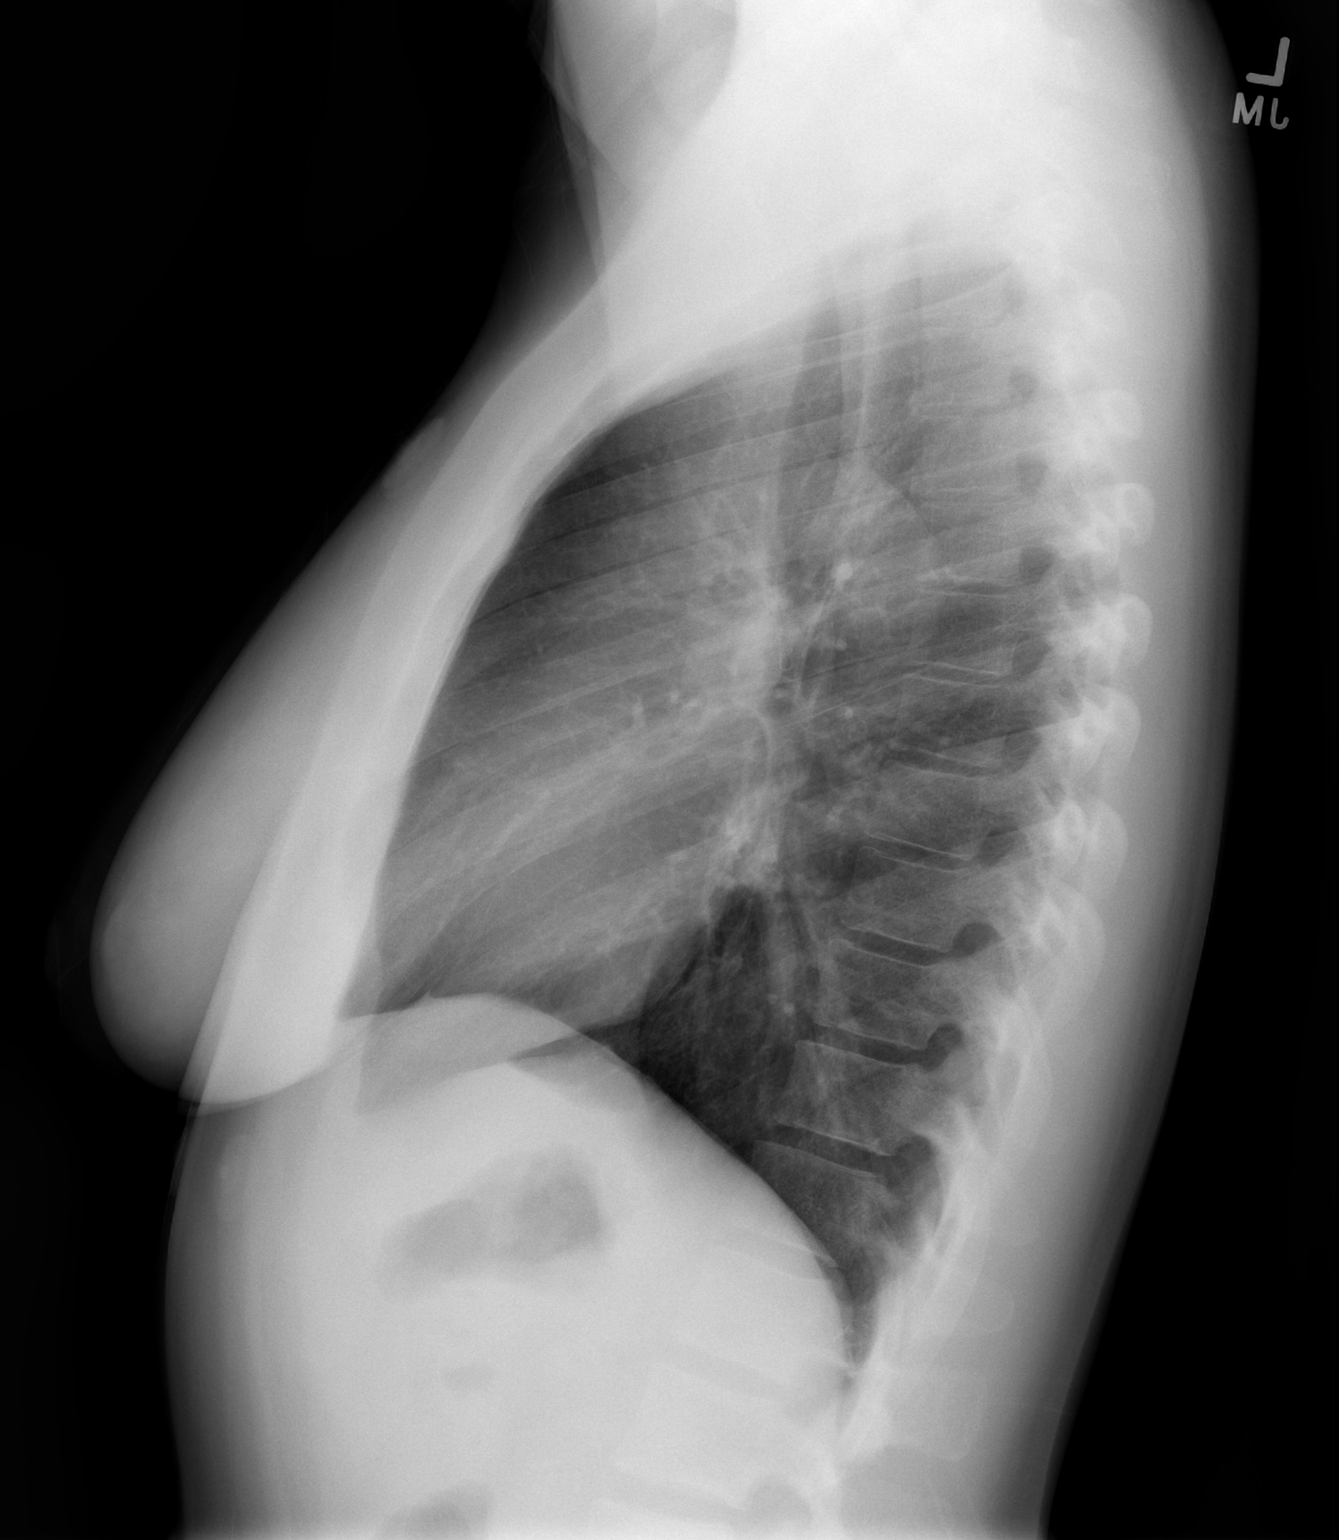

[2 of 2 positions shown; findings below may reference images not displayed]

FINDINGS: The heart size and mediastinal contours are within normal limits.
Both lungs are clear. No pneumothorax or pleural effusion is noted.
The visualized skeletal structures are unremarkable.
IMPRESSION: No active cardiopulmonary disease.
